# Patient Record
Sex: Female | Born: 1960
Health system: Southern US, Community
[De-identification: ages and names within clinical notes are randomized; demographics above are authoritative.]

## PROBLEM LIST (undated history)

## (undated) DIAGNOSIS — I10 Essential (primary) hypertension: Secondary | ICD-10-CM

## (undated) DIAGNOSIS — I5189 Other ill-defined heart diseases: Secondary | ICD-10-CM

## (undated) DIAGNOSIS — I2510 Atherosclerotic heart disease of native coronary artery without angina pectoris: Secondary | ICD-10-CM

## (undated) DIAGNOSIS — M109 Gout, unspecified: Secondary | ICD-10-CM

## (undated) DIAGNOSIS — M7730 Calcaneal spur, unspecified foot: Secondary | ICD-10-CM

## (undated) DIAGNOSIS — I4891 Unspecified atrial fibrillation: Secondary | ICD-10-CM

## (undated) DIAGNOSIS — E785 Hyperlipidemia, unspecified: Secondary | ICD-10-CM

## (undated) DIAGNOSIS — I5032 Chronic diastolic (congestive) heart failure: Secondary | ICD-10-CM

## (undated) DIAGNOSIS — I340 Nonrheumatic mitral (valve) insufficiency: Secondary | ICD-10-CM

## (undated) HISTORY — DX: Calcaneal spur, unspecified foot: M77.30

## (undated) HISTORY — DX: Hyperlipidemia, unspecified: E78.5

## (undated) HISTORY — DX: Gout, unspecified: M10.9

## (undated) HISTORY — DX: Essential (primary) hypertension: I10

## (undated) HISTORY — PX: ACHILLES TENDON REPAIR: SUR1153

---

## 2000-02-21 ENCOUNTER — Encounter: Payer: Self-pay | Admitting: Family Medicine

## 2000-02-21 ENCOUNTER — Ambulatory Visit (HOSPITAL_COMMUNITY): Admission: RE | Admit: 2000-02-21 | Discharge: 2000-02-21 | Payer: Self-pay | Admitting: Family Medicine

## 2002-09-26 ENCOUNTER — Ambulatory Visit (HOSPITAL_COMMUNITY): Admission: RE | Admit: 2002-09-26 | Discharge: 2002-09-26 | Payer: Self-pay | Admitting: Family Medicine

## 2002-09-26 ENCOUNTER — Encounter: Payer: Self-pay | Admitting: Family Medicine

## 2002-11-09 ENCOUNTER — Ambulatory Visit (HOSPITAL_COMMUNITY): Admission: RE | Admit: 2002-11-09 | Discharge: 2002-11-09 | Payer: Self-pay | Admitting: Family Medicine

## 2002-11-09 ENCOUNTER — Encounter: Payer: Self-pay | Admitting: Family Medicine

## 2002-12-05 ENCOUNTER — Ambulatory Visit (HOSPITAL_COMMUNITY): Admission: RE | Admit: 2002-12-05 | Discharge: 2002-12-05 | Payer: Self-pay | Admitting: Gastroenterology

## 2003-01-03 ENCOUNTER — Other Ambulatory Visit: Admission: RE | Admit: 2003-01-03 | Discharge: 2003-01-03 | Payer: Self-pay | Admitting: *Deleted

## 2004-12-02 ENCOUNTER — Ambulatory Visit: Payer: Self-pay

## 2005-08-12 ENCOUNTER — Ambulatory Visit: Payer: Self-pay | Admitting: Hematology and Oncology

## 2005-10-23 ENCOUNTER — Ambulatory Visit: Payer: Self-pay | Admitting: Hematology and Oncology

## 2005-12-09 ENCOUNTER — Ambulatory Visit: Payer: Self-pay | Admitting: Hematology and Oncology

## 2006-03-22 ENCOUNTER — Ambulatory Visit: Payer: Self-pay | Admitting: Hematology and Oncology

## 2006-06-22 ENCOUNTER — Ambulatory Visit: Payer: Self-pay | Admitting: Hematology and Oncology

## 2006-06-24 ENCOUNTER — Encounter (INDEPENDENT_AMBULATORY_CARE_PROVIDER_SITE_OTHER): Payer: Self-pay | Admitting: Specialist

## 2006-06-24 ENCOUNTER — Other Ambulatory Visit: Admission: RE | Admit: 2006-06-24 | Discharge: 2006-06-24 | Payer: Self-pay | Admitting: Hematology and Oncology

## 2006-06-24 LAB — CBC WITH DIFFERENTIAL/PLATELET
BASO%: 0.2 % (ref 0.0–2.0)
Eosinophils Absolute: 0.2 10*3/uL (ref 0.0–0.5)
HCT: 35.1 % (ref 34.8–46.6)
LYMPH%: 28.3 % (ref 14.0–48.0)
MCHC: 33.6 g/dL (ref 32.0–36.0)
MONO#: 0.2 10*3/uL (ref 0.1–0.9)
NEUT#: 3.9 10*3/uL (ref 1.5–6.5)
NEUT%: 64.6 % (ref 39.6–76.8)
Platelets: 438 10*3/uL — ABNORMAL HIGH (ref 145–400)
RBC: 4.09 10*6/uL (ref 3.70–5.32)
WBC: 6.1 10*3/uL (ref 3.9–10.0)
lymph#: 1.7 10*3/uL (ref 0.9–3.3)

## 2006-07-10 LAB — CBC WITH DIFFERENTIAL/PLATELET
Basophils Absolute: 0 10*3/uL (ref 0.0–0.1)
HCT: 34.4 % — ABNORMAL LOW (ref 34.8–46.6)
HGB: 11.7 g/dL (ref 11.6–15.9)
LYMPH%: 22.9 % (ref 14.0–48.0)
MCHC: 33.9 g/dL (ref 32.0–36.0)
MONO#: 0.5 10*3/uL (ref 0.1–0.9)
NEUT%: 68.8 % (ref 39.6–76.8)
Platelets: 566 10*3/uL — ABNORMAL HIGH (ref 145–400)
WBC: 9 10*3/uL (ref 3.9–10.0)
lymph#: 2.1 10*3/uL (ref 0.9–3.3)

## 2006-09-24 ENCOUNTER — Ambulatory Visit: Payer: Self-pay | Admitting: Hematology and Oncology

## 2006-09-29 LAB — BCR/ABL QUALITATIVE: BCR/ABL t(9,22): NEGATIVE

## 2006-09-29 LAB — JAK2 GENOTYPR

## 2007-04-07 ENCOUNTER — Ambulatory Visit: Payer: Self-pay | Admitting: Hematology and Oncology

## 2010-04-04 DIAGNOSIS — D649 Anemia, unspecified: Secondary | ICD-10-CM | POA: Insufficient documentation

## 2010-04-04 DIAGNOSIS — E785 Hyperlipidemia, unspecified: Secondary | ICD-10-CM | POA: Insufficient documentation

## 2011-03-14 NOTE — Op Note (Signed)
   [name], Tammy Chavez                        ACCOUNT NO.:  [id number]   MEDICAL RECORD NO.:  [id number]                   PATIENT TYPE:  AMB   LOCATION:  ENDO                                 FACILITY:  [facility]   PHYSICIAN:  John C. Madilyn Fireman, M.D.                 DATE OF BIRTH:  May 29, 1961   DATE OF PROCEDURE:  12/05/2002  DATE OF DISCHARGE:                                 OPERATIVE REPORT   PROCEDURE:  Colonoscopy.   INDICATIONS FOR PROCEDURE:  Iron deficiency anemia, the procedure is to rule  out neoplasm of the colon or inflammatory condition of the colon as a source  of iron deficiency anemia for possible GI blood loss.   DESCRIPTION OF PROCEDURE:  The patient was placed in the left lateral  decubitus position and placed on the pulse monitor with continuous low flow  oxygen delivered by nasal cannula. She was sedated with 100 micrograms of IV  Fentanyl and 8 mg of IV Versed.   The Olympus video colonoscope was inserted into the rectum and advanced to  the cecum, confirmed by transillumination of McBurney's point and  visualization of the ileocecal valve and appendiceal orifice.  The prep was  excellent.   The cecum, ascending , transverse, descending and sigmoid colon all appeared  normal with no masses, polyps, diverticula or other mucosal abnormalities.  The rectum likewise appeared normal. Retroflexed view of the anus revealed  no obvious internal hemorrhoids.   The colonoscope was then withdrawn and the patient was returned to the  recovery room in stable condition. She tolerated the procedure well and  there were no immediate complications.   IMPRESSION:  Normal colonoscopy.   PLAN:  Iron supplementation and other workup for her anemia per her primary  care physician.                                                  John C. Madilyn Fireman, M.D.    J[facility]/MEDQ  D:  12/05/2002  T:  12/05/2002  Job:  [id number]   cc:   Pam Drown, M.D.  [address]  [place]  [place] [address]  Fax: [phone]

## 2011-07-25 DIAGNOSIS — I10 Essential (primary) hypertension: Secondary | ICD-10-CM | POA: Insufficient documentation

## 2011-07-25 DIAGNOSIS — M109 Gout, unspecified: Secondary | ICD-10-CM | POA: Insufficient documentation

## 2013-03-01 ENCOUNTER — Other Ambulatory Visit: Payer: Self-pay

## 2013-03-01 MED ORDER — ATORVASTATIN CALCIUM 80 MG PO TABS: 80.0000 mg | ORAL_TABLET | Freq: Every day | ORAL | Status: DC

## 2013-03-01 MED ORDER — POTASSIUM CHLORIDE ER 10 MEQ PO TBCR: 10.0000 meq | EXTENDED_RELEASE_TABLET | Freq: Two times a day (BID) | ORAL | Status: DC

## 2013-03-01 MED ORDER — ALLOPURINOL 300 MG PO TABS: 300.0000 mg | ORAL_TABLET | Freq: Every day | ORAL | Status: DC

## 2013-03-01 MED ORDER — BENAZEPRIL HCL 40 MG PO TABS: 40.0000 mg | ORAL_TABLET | Freq: Every day | ORAL | Status: DC

## 2013-03-01 MED ORDER — FUROSEMIDE 40 MG PO TABS: 40.0000 mg | ORAL_TABLET | Freq: Every day | ORAL | Status: DC

## 2013-03-01 NOTE — Telephone Encounter (Signed)
Last seen 10/01/12   Last lipids 09/22/12   If approved print and have nurse call patient to pick up

## 2013-03-07 ENCOUNTER — Other Ambulatory Visit: Payer: Self-pay | Admitting: Family Medicine

## 2013-03-08 ENCOUNTER — Telehealth: Payer: Self-pay | Admitting: *Deleted

## 2013-03-08 MED ORDER — ATORVASTATIN CALCIUM 80 MG PO TABS: 80.0000 mg | ORAL_TABLET | Freq: Every day | ORAL | Status: DC

## 2013-03-08 MED ORDER — POTASSIUM CHLORIDE ER 10 MEQ PO TBCR: 10.0000 meq | EXTENDED_RELEASE_TABLET | Freq: Two times a day (BID) | ORAL | Status: DC

## 2013-03-08 MED ORDER — FUROSEMIDE 40 MG PO TABS: 40.0000 mg | ORAL_TABLET | Freq: Every day | ORAL | Status: DC

## 2013-03-08 MED ORDER — BENAZEPRIL HCL 40 MG PO TABS: 40.0000 mg | ORAL_TABLET | Freq: Every day | ORAL | Status: DC

## 2013-03-08 MED ORDER — ALLOPURINOL 300 MG PO TABS: 300.0000 mg | ORAL_TABLET | Freq: Every day | ORAL | Status: DC

## 2013-03-08 NOTE — Telephone Encounter (Signed)
Pt lost the ones that were done on 03/01/13. Call pt at [phone] or [phone]

## 2013-03-08 NOTE — Telephone Encounter (Signed)
Left messages for pt , Rx 's  are ready for pick up.

## 2013-03-11 ENCOUNTER — Ambulatory Visit: Payer: Self-pay | Admitting: Nurse Practitioner

## 2013-03-24 ENCOUNTER — Ambulatory Visit (INDEPENDENT_AMBULATORY_CARE_PROVIDER_SITE_OTHER): Payer: BC Managed Care – PPO | Admitting: Physician Assistant

## 2013-03-24 ENCOUNTER — Encounter: Payer: Self-pay | Admitting: Physician Assistant

## 2013-03-24 VITALS — BP 92/57 | HR 72 | Temp 98.3°F | Ht 60.0 in | Wt 262.0 lb

## 2013-03-24 DIAGNOSIS — E785 Hyperlipidemia, unspecified: Secondary | ICD-10-CM

## 2013-03-24 DIAGNOSIS — I10 Essential (primary) hypertension: Secondary | ICD-10-CM

## 2013-03-24 DIAGNOSIS — E876 Hypokalemia: Secondary | ICD-10-CM

## 2013-03-24 DIAGNOSIS — Z23 Encounter for immunization: Secondary | ICD-10-CM

## 2013-03-24 DIAGNOSIS — M109 Gout, unspecified: Secondary | ICD-10-CM

## 2013-03-24 LAB — BASIC METABOLIC PANEL WITH GFR
BUN: 22 mg/dL (ref 6–23)
Calcium: 10.4 mg/dL (ref 8.4–10.5)
Creat: 0.98 mg/dL (ref 0.50–1.10)
GFR, Est African American: 77 mL/min
GFR, Est Non African American: 67 mL/min

## 2013-03-24 LAB — HEPATIC FUNCTION PANEL
ALT: 26 U/L (ref 0–35)
AST: 22 U/L (ref 0–37)
Albumin: 4.4 g/dL (ref 3.5–5.2)
Alkaline Phosphatase: 52 U/L (ref 39–117)
Total Protein: 7.8 g/dL (ref 6.0–8.3)

## 2013-03-24 LAB — LIPID PANEL
Cholesterol: 252 mg/dL — ABNORMAL HIGH (ref 0–200)
HDL: 48 mg/dL (ref >39)
LDL Cholesterol: 156 mg/dL — ABNORMAL HIGH (ref 0–99)
Triglycerides: 240 mg/dL — ABNORMAL HIGH (ref <150)

## 2013-03-24 MED ORDER — ATORVASTATIN CALCIUM 80 MG PO TABS: 80.0000 mg | ORAL_TABLET | Freq: Every day | ORAL | Status: DC

## 2013-03-24 MED ORDER — CHOLINE FENOFIBRATE 135 MG PO CPDR: 135.0000 mg | DELAYED_RELEASE_CAPSULE | Freq: Every day | ORAL | Status: DC

## 2013-03-24 MED ORDER — EZETIMIBE 10 MG PO TABS: 10.0000 mg | ORAL_TABLET | Freq: Every day | ORAL | Status: DC

## 2013-03-24 MED ORDER — FUROSEMIDE 40 MG PO TABS: 40.0000 mg | ORAL_TABLET | Freq: Every day | ORAL | Status: DC

## 2013-03-24 MED ORDER — BENAZEPRIL HCL 40 MG PO TABS: 40.0000 mg | ORAL_TABLET | Freq: Every day | ORAL | Status: DC

## 2013-03-24 MED ORDER — POTASSIUM CHLORIDE ER 10 MEQ PO TBCR: 10.0000 meq | EXTENDED_RELEASE_TABLET | Freq: Two times a day (BID) | ORAL | Status: DC

## 2013-03-24 MED ORDER — ALLOPURINOL 300 MG PO TABS: 300.0000 mg | ORAL_TABLET | Freq: Every day | ORAL | Status: DC

## 2013-03-24 NOTE — Progress Notes (Signed)
Subjective:     Patient ID: Tammy Chavez, female   DOB: 1961-07-12, 52 y.o.   MRN: [id number]  HPI Pt here for review of her gout, HTN, hyperlipid, hypokal She states all in all she has been doing well Pt has been unable to afford Zetia so has not been taking Strong FH of resistant lipid elevation She denies CP, SOB, or change in exercise tolerance Pt with intermit lower ext edema  Review of Systems  All other systems reviewed and are negative.       Objective:   Physical Exam  Nursing note and vitals reviewed. Constitutional: She appears well-developed and well-nourished.  HENT:  Mouth/Throat: Oropharynx is clear and moist.  Neck: Neck supple. No JVD present. No thyromegaly present.  No bruits  Cardiovascular: Normal rate, regular rhythm, normal heart sounds and intact distal pulses.   Trace lower ext edema  Pulmonary/Chest: Effort normal and breath sounds normal.  Lymphadenopathy:    She has no cervical adenopathy.  Skin: Skin is warm and dry.       Assessment:     1. Need for Tdap vaccination   2. Hypertension   3. Hyperlipidemia   4. Hypokalemia   5. Gout   6. HTN (hypertension)   7. Other and unspecified hyperlipidemia        Plan:     Meds rf today She is going to check on Zetia to see if she can afford- gave savings card Stressed diet/exercise as well Cont meds Will inform of lab results Updated Immun- Tdap Rest of health maint UTD F/U in 6 months

## 2013-03-24 NOTE — Patient Instructions (Addendum)
Tetanus, Diphtheria, Pertussis (Tdap) Vaccine What You Need to Know WHY GET VACCINATED? Tetanus, diphtheria and pertussis can be very serious diseases, even for adolescents and adults. Tdap vaccine can protect us from these diseases. TETANUS (Lockjaw) causes painful muscle tightening and stiffness, usually all over the body.  It can lead to tightening of muscles in the head and neck so you can't open your mouth, swallow, or sometimes even breathe. Tetanus kills about 1 out of 5 people who are infected. DIPHTHERIA can cause a thick coating to form in the back of the throat.  It can lead to breathing problems, paralysis, heart failure, and death. PERTUSSIS (Whooping Cough) causes severe coughing spells, which can cause difficulty breathing, vomiting and disturbed sleep.  It can also lead to weight loss, incontinence, and rib fractures. Up to 2 in 100 adolescents and 5 in 100 adults with pertussis are hospitalized or have complications, which could include pneumonia and death. These diseases are caused by bacteria. Diphtheria and pertussis are spread from person to person through coughing or sneezing. Tetanus enters the body through cuts, scratches, or wounds. Before vaccines, the [place] saw as many as 200,000 cases a year of diphtheria and pertussis, and hundreds of cases of tetanus. Since vaccination began, tetanus and diphtheria have dropped by about 99% and pertussis by about 80%. TDAP VACCINE Tdap vaccine can protect adolescents and adults from tetanus, diphtheria, and pertussis. One dose of Tdap is routinely given at age 11 or 12. People who did not get Tdap at that age should get it as soon as possible. Tdap is especially important for health care professionals and anyone having close contact with a baby younger than 12 months. Pregnant women should get a dose of Tdap during every pregnancy, to protect the newborn from pertussis. Infants are most at risk for severe, life-threatening  complications from pertussis. A similar vaccine, called Td, protects from tetanus and diphtheria, but not pertussis. A Td booster should be given every 10 years. Tdap may be given as one of these boosters if you have not already gotten a dose. Tdap may also be given after a severe cut or burn to prevent tetanus infection. Your doctor can give you more information. Tdap may safely be given at the same time as other vaccines. SOME PEOPLE SHOULD NOT GET THIS VACCINE  If you ever had a life-threatening allergic reaction after a dose of any tetanus, diphtheria, or pertussis containing vaccine, OR if you have a severe allergy to any part of this vaccine, you should not get Tdap. Tell your doctor if you have any severe allergies.  If you had a coma, or long or multiple seizures within 7 days after a childhood dose of DTP or DTaP, you should not get Tdap, unless a cause other than the vaccine was found. You can still get Td.  Talk to your doctor if you:  have epilepsy or another nervous system problem,  had severe pain or swelling after any vaccine containing diphtheria, tetanus or pertussis,  ever had Guillain-Barr Syndrome (GBS),  aren't feeling well on the day the shot is scheduled. RISKS OF A VACCINE REACTION With any medicine, including vaccines, there is a chance of side effects. These are usually mild and go away on their own, but serious reactions are also possible. Brief fainting spells can follow a vaccination, leading to injuries from falling. Sitting or lying down for about 15 minutes can help prevent these. Tell your doctor if you feel dizzy or light-headed, or   have vision changes or ringing in the ears. Mild problems following Tdap (Did not interfere with activities)  Pain where the shot was given (about 3 in 4 adolescents or 2 in 3 adults)  Redness or swelling where the shot was given (about 1 person in 5)  Mild fever of at least 100.73F (up to about 1 in 25 adolescents or 1 in  100 adults)  Headache (about 3 or 4 people in 10)  Tiredness (about 1 person in 3 or 4)  Nausea, vomiting, diarrhea, stomach ache (up to 1 in 4 adolescents or 1 in 10 adults)  Chills, body aches, sore joints, rash, swollen glands (uncommon) Moderate problems following Tdap (Interfered with activities, but did not require medical attention)  Pain where the shot was given (about 1 in 5 adolescents or 1 in 100 adults)  Redness or swelling where the shot was given (up to about 1 in 16 adolescents or 1 in 25 adults)  Fever over 102F (about 1 in 100 adolescents or 1 in 250 adults)  Headache (about 3 in 20 adolescents or 1 in 10 adults)  Nausea, vomiting, diarrhea, stomach ache (up to 1 or 3 people in 100)  Swelling of the entire arm where the shot was given (up to about 3 in 100). Severe problems following Tdap (Unable to perform usual activities, required medical attention)  Swelling, severe pain, bleeding and redness in the arm where the shot was given (rare). A severe allergic reaction could occur after any vaccine (estimated less than 1 in a million doses). WHAT IF THERE IS A SERIOUS REACTION? What should I look for?  Look for anything that concerns you, such as signs of a severe allergic reaction, very high fever, or behavior changes. Signs of a severe allergic reaction can include hives, swelling of the face and throat, difficulty breathing, a fast heartbeat, dizziness, and weakness. These would start a few minutes to a few hours after the vaccination. What should I do?  If you think it is a severe allergic reaction or other emergency that can't wait, call 9-1-1 or get the person to the nearest hospital. Otherwise, call your doctor.  Afterward, the reaction should be reported to the "Vaccine Adverse Event Reporting System" (VAERS). Your doctor might file this report, or you can do it yourself through the VAERS web site at www.vaers.LAgents.no, or by calling 1-(250)887-7684. VAERS is  only for reporting reactions. They do not give medical advice.  THE NATIONAL VACCINE INJURY COMPENSATION PROGRAM The National Vaccine Injury Compensation Program (VICP) is a federal program that was created to compensate people who may have been injured by certain vaccines. Persons who believe they may have been injured by a vaccine can learn about the program and about filing a claim by calling 1-(229)037-6800 or visiting the VICP website at SpiritualWord.at. HOW CAN I LEARN MORE?  Ask your doctor.  Call your local or state health department.  Contact the Centers for Disease Control and Prevention (CDC):  Call 670 094 9909 or visit CDC's website at PicCapture.uy. CDC Tdap Vaccine VIS (03/04/12) Document Released: 04/13/2012 Document Revised: 07/07/2012 Document Reviewed: 04/13/2012 [employer] Patient Information 2014 [place], [place]. Hypertriglyceridemia  Diet for High blood levels of Triglycerides Most fats in food are triglycerides. Triglycerides in your blood are stored as fat in your body. High levels of triglycerides in your blood may put you at a greater risk for heart disease and stroke.  Normal triglyceride levels are less than 150 mg/dL. Borderline high levels are 150-199 mg/dl. High  levels are 200 - 499 mg/dL, and very high triglyceride levels are greater than 500 mg/dL. The decision to treat high triglycerides is generally based on the level. For people with borderline or high triglyceride levels, treatment includes weight loss and exercise. Drugs are recommended for people with very high triglyceride levels. Many people who need treatment for high triglyceride levels have metabolic syndrome. This syndrome is a collection of disorders that often include: insulin resistance, high blood pressure, blood clotting problems, high cholesterol and triglycerides. TESTING PROCEDURE FOR TRIGLYCERIDES  You should not eat 4 hours before getting your triglycerides measured.  The normal range of triglycerides is between 10 and 250 milligrams per deciliter (mg/dl). Some people may have extreme levels (1000 or above), but your triglyceride level may be too high if it is above 150 mg/dl, depending on what other risk factors you have for heart disease.  People with high blood triglycerides may also have high blood cholesterol levels. If you have high blood cholesterol as well as high blood triglycerides, your risk for heart disease is probably greater than if you only had high triglycerides. High blood cholesterol is one of the main risk factors for heart disease. CHANGING YOUR DIET  Your weight can affect your blood triglyceride level. If you are more than 20% above your ideal body weight, you may be able to lower your blood triglycerides by losing weight. Eating less and exercising regularly is the best way to combat this. Fat provides more calories than any other food. The best way to lose weight is to eat less fat. Only 30% of your total calories should come from fat. Less than 7% of your diet should come from saturated fat. A diet low in fat and saturated fat is the same as a diet to decrease blood cholesterol. By eating a diet lower in fat, you may lose weight, lower your blood cholesterol, and lower your blood triglyceride level.  Eating a diet low in fat, especially saturated fat, may also help you lower your blood triglyceride level. Ask your dietitian to help you figure how much fat you can eat based on the number of calories your caregiver has prescribed for you.  Exercise, in addition to helping with weight loss may also help lower triglyceride levels.   Alcohol can increase blood triglycerides. You may need to stop drinking alcoholic beverages.  Too much carbohydrate in your diet may also increase your blood triglycerides. Some complex carbohydrates are necessary in your diet. These may include bread, rice, potatoes, other starchy vegetables and cereals.  Reduce  "simple" carbohydrates. These may include pure sugars, candy, honey, and jelly without losing other nutrients. If you have the kind of high blood triglycerides that is affected by the amount of carbohydrates in your diet, you will need to eat less sugar and less high-sugar foods. Your caregiver can help you with this.  Adding 2-4 grams of fish oil (EPA+ DHA) may also help lower triglycerides. Speak with your caregiver before adding any supplements to your regimen. Following the Diet  Maintain your ideal weight. Your caregivers can help you with a diet. Generally, eating less food and getting more exercise will help you lose weight. Joining a weight control group may also help. Ask your caregivers for a good weight control group in your area.  Eat low-fat foods instead of high-fat foods. This can help you lose weight too.  These foods are lower in fat. Eat MORE of these:   Dried beans, peas, and lentils.  Egg whites.  Low-fat cottage cheese.  Fish.  Lean cuts of meat, such as round, sirloin, rump, and flank (cut extra fat off meat you fix).  Whole grain breads, cereals and pasta.  Skim and nonfat dry milk.  Low-fat yogurt.  Poultry without the skin.  Cheese made with skim or part-skim milk, such as mozzarella, parmesan, farmers', ricotta, or pot cheese. These are higher fat foods. Eat LESS of these:   Whole milk and foods made from whole milk, such as American, blue, cheddar, monterey jack, and swiss cheese  High-fat meats, such as luncheon meats, sausages, knockwurst, bratwurst, hot dogs, ribs, corned beef, ground pork, and regular ground beef.  Fried foods. Limit saturated fats in your diet. Substituting unsaturated fat for saturated fat may decrease your blood triglyceride level. You will need to read package labels to know which products contain saturated fats.  These foods are high in saturated fat. Eat LESS of these:   Fried pork skins.  Whole milk.  Skin and fat from  poultry.  Palm oil.  Butter.  Shortening.  Cream cheese.  Tomasa Blase.  Margarines and baked goods made from listed oils.  Vegetable shortenings.  Chitterlings.  Fat from meats.  Coconut oil.  Palm kernel oil.  Lard.  Cream.  Sour cream.  Fatback.  Coffee whiteners and non-dairy creamers made with these oils.  Cheese made from whole milk. Use unsaturated fats (both polyunsaturated and monounsaturated) moderately. Remember, even though unsaturated fats are better than saturated fats; you still want a diet low in total fat.  These foods are high in unsaturated fat:   Canola oil.  Sunflower oil.  Mayonnaise.  Almonds.  Peanuts.  Pine nuts.  Margarines made with these oils.  Safflower oil.  Olive oil.  Avocados.  Cashews.  Peanut butter.  Sunflower seeds.  Soybean oil.  Peanut oil.  Olives.  Pecans.  Walnuts.  Pumpkin seeds. Avoid sugar and other high-sugar foods. This will decrease carbohydrates without decreasing other nutrients. Sugar in your food goes rapidly to your blood. When there is excess sugar in your blood, your liver may use it to make more triglycerides. Sugar also contains calories without other important nutrients.  Eat LESS of these:   Sugar, brown sugar, powdered sugar, jam, jelly, preserves, honey, syrup, molasses, pies, candy, cakes, cookies, frosting, pastries, colas, soft drinks, punches, fruit drinks, and regular gelatin.  Avoid alcohol. Alcohol, even more than sugar, may increase blood triglycerides. In addition, alcohol is high in calories and low in nutrients. Ask for sparkling water, or a diet soft drink instead of an alcoholic beverage. Suggestions for planning and preparing meals   Bake, broil, grill or roast meats instead of frying.  Remove fat from meats and skin from poultry before cooking.  Add spices, herbs, lemon juice or vinegar to vegetables instead of salt, rich sauces or gravies.  Use a non-stick  skillet without fat or use no-stick sprays.  Cool and refrigerate stews and broth. Then remove the hardened fat floating on the surface before serving.  Refrigerate meat drippings and skim off fat to make low-fat gravies.  Serve more fish.  Use less butter, margarine and other high-fat spreads on bread or vegetables.  Use skim or reconstituted non-fat dry milk for cooking.  Cook with low-fat cheeses.  Substitute low-fat yogurt or cottage cheese for all or part of the sour cream in recipes for sauces, dips or congealed salads.  Use half yogurt/half mayonnaise in salad recipes.  Substitute evaporated skim milk  for cream. Evaporated skim milk or reconstituted non-fat dry milk can be whipped and substituted for whipped cream in certain recipes.  Choose fresh fruits for dessert instead of high-fat foods such as pies or cakes. Fruits are naturally low in fat. When Dining Out   Order low-fat appetizers such as fruit or vegetable juice, pasta with vegetables or tomato sauce.  Select clear, rather than cream soups.  Ask that dressings and gravies be served on the side. Then use less of them.  Order foods that are baked, broiled, poached, steamed, stir-fried, or roasted.  Ask for margarine instead of butter, and use only a small amount.  Drink sparkling water, unsweetened tea or coffee, or diet soft drinks instead of alcohol or other sweet beverages. QUESTIONS AND ANSWERS ABOUT OTHER FATS IN THE BLOOD: SATURATED FAT, TRANS FAT, AND CHOLESTEROL What is trans fat? Trans fat is a type of fat that is formed when vegetable oil is hardened through a process called hydrogenation. This process helps makes foods more solid, gives them shape, and prolongs their shelf life. Trans fats are also called hydrogenated or partially hydrogenated oils.  What do saturated fat, trans fat, and cholesterol in foods have to do with heart disease? Saturated fat, trans fat, and cholesterol in the diet all raise  the level of LDL "bad" cholesterol in the blood. The higher the LDL cholesterol, the greater the risk for coronary heart disease (CHD). Saturated fat and trans fat raise LDL similarly.  What foods contain saturated fat, trans fat, and cholesterol? High amounts of saturated fat are found in animal products, such as fatty cuts of meat, chicken skin, and full-fat dairy products like butter, whole milk, cream, and cheese, and in tropical vegetable oils such as palm, palm kernel, and coconut oil. Trans fat is found in some of the same foods as saturated fat, such as vegetable shortening, some margarines (especially hard or stick margarine), crackers, cookies, baked goods, fried foods, salad dressings, and other processed foods made with partially hydrogenated vegetable oils. Small amounts of trans fat also occur naturally in some animal products, such as milk products, beef, and lamb. Foods high in cholesterol include liver, other organ meats, egg yolks, shrimp, and full-fat dairy products. How can I use the new food label to make heart-healthy food choices? Check the Nutrition Facts panel of the food label. Choose foods lower in saturated fat, trans fat, and cholesterol. For saturated fat and cholesterol, you can also use the Percent Daily Value (%DV): 5% DV or less is low, and 20% DV or more is high. (There is no %DV for trans fat.) Use the Nutrition Facts panel to choose foods low in saturated fat and cholesterol, and if the trans fat is not listed, read the ingredients and limit products that list shortening or hydrogenated or partially hydrogenated vegetable oil, which tend to be high in trans fat. POINTS TO REMEMBER:   Discuss your risk for heart disease with your caregivers, and take steps to reduce risk factors.  Change your diet. Choose foods that are low in saturated fat, trans fat, and cholesterol.  Add exercise to your daily routine if it is not already being done. Participate in physical activity  of moderate intensity, like brisk walking, for at least 30 minutes on most, and preferably all days of the week. No time? Break the 30 minutes into three, 10-minute segments during the day.  Stop smoking. If you do smoke, contact your caregiver to discuss ways in which they can  help you quit.  Do not use street drugs.  Maintain a normal weight.  Maintain a healthy blood pressure.  Keep up with your blood work for checking the fats in your blood as directed by your caregiver. Document Released: 07/31/2004 Document Revised: 04/13/2012 Document Reviewed: 02/26/2009 [facility] Patient Information 2014 [place], [place].

## 2013-03-25 ENCOUNTER — Encounter: Payer: Self-pay | Admitting: *Deleted

## 2013-08-24 ENCOUNTER — Ambulatory Visit (INDEPENDENT_AMBULATORY_CARE_PROVIDER_SITE_OTHER): Payer: BC Managed Care – PPO | Admitting: General Practice

## 2013-08-24 ENCOUNTER — Telehealth: Payer: Self-pay | Admitting: Nurse Practitioner

## 2013-08-24 ENCOUNTER — Encounter: Payer: Self-pay | Admitting: General Practice

## 2013-08-24 VITALS — BP 105/64 | HR 85 | Temp 98.2°F | Ht 60.0 in | Wt 270.0 lb

## 2013-08-24 DIAGNOSIS — R05 Cough: Secondary | ICD-10-CM

## 2013-08-24 DIAGNOSIS — J069 Acute upper respiratory infection, unspecified: Secondary | ICD-10-CM

## 2013-08-24 DIAGNOSIS — R059 Cough, unspecified: Secondary | ICD-10-CM

## 2013-08-24 MED ORDER — AZITHROMYCIN 250 MG PO TABS: ORAL_TABLET | ORAL | Status: DC

## 2013-08-24 MED ORDER — BENZONATATE 100 MG PO CAPS: 100.0000 mg | ORAL_CAPSULE | Freq: Three times a day (TID) | ORAL | Status: DC | PRN

## 2013-08-24 NOTE — Progress Notes (Signed)
  Subjective:    Patient ID: Tammy Chavez, female    DOB: 20-Jun-1961, 52 y.o.   MRN: [id number]  Cough This is a new problem. The current episode started in the past 7 days. The problem has been gradually worsening. The problem occurs every few minutes. The cough is productive of purulent sputum. Associated symptoms include chills, ear congestion and nasal congestion. Pertinent negatives include no chest pain, fever, headaches, postnasal drip, sore throat or shortness of breath. The symptoms are aggravated by lying down. She has tried OTC cough suppressant for the symptoms. There is no history of asthma, bronchitis, COPD or pneumonia.      Review of Systems  Constitutional: Positive for chills. Negative for fever.  HENT: Positive for congestion and sneezing. Negative for postnasal drip, sinus pressure and sore throat.   Respiratory: Positive for cough. Negative for shortness of breath.   Cardiovascular: Negative for chest pain.  Neurological: Negative for dizziness, weakness and headaches.       Objective:   Physical Exam  Constitutional: She is oriented to person, place, and time. She appears well-developed and well-nourished.  HENT:  Head: Normocephalic and atraumatic.  Right Ear: External ear normal.  Left Ear: External ear normal.  Mouth/Throat: Posterior oropharyngeal erythema present.  Eyes: Conjunctivae are normal. Pupils are equal, round, and reactive to light.  Neck: Normal range of motion. Neck supple. No thyromegaly present.  Cardiovascular: Normal rate, regular rhythm and normal heart sounds.   Pulmonary/Chest: Effort normal and breath sounds normal. No respiratory distress. She exhibits no tenderness.  Patient coughing during exam  Lymphadenopathy:    She has no cervical adenopathy.  Neurological: She is alert and oriented to person, place, and time.  Skin: Skin is warm and dry.  Psychiatric: She has a normal mood and affect.          Assessment & Plan:  1.  Upper respiratory infection  - azithromycin (ZITHROMAX) 250 MG tablet; Take as directed  Dispense: 6 tablet; Refill: 0   2. Cough  - benzonatate (TESSALON PERLES) 100 MG capsule; Take 1 capsule (100 mg total) by mouth 3 (three) times daily as needed for cough.  Dispense: 30 capsule; Refill: 0 -RTO if symptoms worsen or unresolved within two weeks -Patient verbalized understanding -Coralie Keens, FNP-C

## 2013-08-24 NOTE — Patient Instructions (Signed)

## 2013-08-24 NOTE — Telephone Encounter (Signed)
Appt given for today 

## 2014-02-03 ENCOUNTER — Telehealth: Payer: Self-pay | Admitting: General Practice

## 2014-02-03 NOTE — Telephone Encounter (Signed)
Appt given per patients request 

## 2014-02-08 ENCOUNTER — Encounter: Payer: Self-pay | Admitting: General Practice

## 2014-02-08 ENCOUNTER — Ambulatory Visit (INDEPENDENT_AMBULATORY_CARE_PROVIDER_SITE_OTHER): Payer: BC Managed Care – PPO | Admitting: General Practice

## 2014-02-08 VITALS — BP 138/68 | Temp 97.7°F | Ht 60.0 in | Wt 268.6 lb

## 2014-02-08 DIAGNOSIS — I10 Essential (primary) hypertension: Secondary | ICD-10-CM

## 2014-02-08 DIAGNOSIS — E785 Hyperlipidemia, unspecified: Secondary | ICD-10-CM

## 2014-02-08 DIAGNOSIS — J302 Other seasonal allergic rhinitis: Secondary | ICD-10-CM

## 2014-02-08 DIAGNOSIS — J309 Allergic rhinitis, unspecified: Secondary | ICD-10-CM

## 2014-02-08 DIAGNOSIS — M109 Gout, unspecified: Secondary | ICD-10-CM

## 2014-02-08 DIAGNOSIS — E876 Hypokalemia: Secondary | ICD-10-CM

## 2014-02-08 LAB — POCT CBC
GRANULOCYTE PERCENT: 63.6 % (ref 37–80)
HCT, POC: 41.1 % (ref 37.7–47.9)
Hemoglobin: 13 g/dL (ref 12.2–16.2)
LYMPH, POC: 2.9 (ref 0.6–3.4)
MCH: 27.2 pg (ref 27–31.2)
MCHC: 31.6 g/dL — AB (ref 31.8–35.4)
MCV: 86 fL (ref 80–97)
MPV: 8 fL (ref 0–99.8)
PLATELET COUNT, POC: 474 10*3/uL — AB (ref 142–424)
POC GRANULOCYTE: 5.7 (ref 2–6.9)
POC LYMPH %: 32.5 % (ref 10–50)
RBC: 4.8 M/uL (ref 4.04–5.48)
RDW, POC: 16.1 %
WBC: 8.9 10*3/uL (ref 4.6–10.2)

## 2014-02-08 MED ORDER — FUROSEMIDE 40 MG PO TABS: 40.0000 mg | ORAL_TABLET | Freq: Every day | ORAL | Status: DC

## 2014-02-08 MED ORDER — BENAZEPRIL HCL 40 MG PO TABS: 40.0000 mg | ORAL_TABLET | Freq: Every day | ORAL | Status: DC

## 2014-02-08 MED ORDER — ATORVASTATIN CALCIUM 80 MG PO TABS: 80.0000 mg | ORAL_TABLET | Freq: Every day | ORAL | Status: DC

## 2014-02-08 MED ORDER — ALLOPURINOL 300 MG PO TABS: 300.0000 mg | ORAL_TABLET | Freq: Every day | ORAL | Status: DC

## 2014-02-08 MED ORDER — POTASSIUM CHLORIDE ER 10 MEQ PO TBCR: 10.0000 meq | EXTENDED_RELEASE_TABLET | Freq: Two times a day (BID) | ORAL | Status: DC

## 2014-02-08 NOTE — Patient Instructions (Signed)

## 2014-02-08 NOTE — Progress Notes (Signed)
   Subjective:    Patient ID: Tammy Chavez, female    DOB: Jun 21, 1961, 53 y.o.   MRN: [id number]  HPI Patient presents today for chronic health follow up. History of hyperlipidemia, hypertension, hypokalemia, and gout. Also reports seasonal allergies. Taking medications as prescribed. Reports walking on treadmill daily and eating regular diet.     Review of Systems  Constitutional: Negative for fever and chills.  HENT: Positive for postnasal drip, rhinorrhea and sneezing.   Respiratory: Negative for chest tightness and shortness of breath.   Cardiovascular: Negative for chest pain and palpitations.  Gastrointestinal: Negative for nausea, vomiting, abdominal pain, diarrhea, constipation and blood in stool.  Genitourinary: Negative for difficulty urinating.  Neurological: Negative for dizziness, weakness and headaches.  Psychiatric/Behavioral: Negative for suicidal ideas and self-injury. The patient is not nervous/anxious.        Objective:   Physical Exam  Constitutional: She is oriented to person, place, and time. She appears well-developed and well-nourished.  HENT:  Head: Normocephalic and atraumatic.  Right Ear: External ear normal.  Left Ear: External ear normal.  Mouth/Throat: Oropharynx is clear and moist.  Eyes: EOM are normal. Pupils are equal, round, and reactive to light.  Neck: Normal range of motion. Neck supple. No thyromegaly present.  Cardiovascular: Normal rate, regular rhythm and normal heart sounds.   Pulmonary/Chest: Effort normal and breath sounds normal. No respiratory distress. She exhibits no tenderness.  Abdominal: Soft. Bowel sounds are normal. She exhibits no distension. There is no tenderness.  Lymphadenopathy:    She has no cervical adenopathy.  Neurological: She is alert and oriented to person, place, and time.  Skin: Skin is warm and dry.  Psychiatric: She has a normal mood and affect.          Assessment & Plan:  1. Hypertension  -  POCT CBC - CMP14+EGFR - benazepril (LOTENSIN) 40 MG tablet; Take 1 tablet (40 mg total) by mouth daily.  Dispense: 90 tablet; Refill: 1 - furosemide (LASIX) 40 MG tablet; Take 1 tablet (40 mg total) by mouth daily.  Dispense: 90 tablet; Refill: 1  2. Hyperlipidemia  - Lipid panel - atorvastatin (LIPITOR) 80 MG tablet; Take 1 tablet (80 mg total) by mouth daily.  Dispense: 90 tablet; Refill: 1  3. Gout  - allopurinol (ZYLOPRIM) 300 MG tablet; Take 1 tablet (300 mg total) by mouth daily.  Dispense: 90 tablet; Refill: 1  4. Hypokalemia  - potassium chloride (KLOR-CON 10) 10 MEQ tablet; Take 1 tablet (10 mEq total) by mouth 2 (two) times daily.  Dispense: 90 tablet; Refill: 1  5. Seasonal allergies -OTC zyrtec, allegra, or claritin as directed -avoid allergens -Continue all current medications Labs pending F/u in 3 months Discussed benefits of regular exercise and healthy eating Patient verbalized understanding Erby Pian, FNP-C

## 2014-02-09 LAB — CMP14+EGFR
ALT: 36 IU/L — ABNORMAL HIGH (ref 0–32)
AST: 46 IU/L — AB (ref 0–40)
Albumin/Globulin Ratio: 1.6 (ref 1.1–2.5)
Albumin: 4.8 g/dL (ref 3.5–5.5)
Alkaline Phosphatase: 47 IU/L (ref 39–117)
BUN/Creatinine Ratio: 30 — ABNORMAL HIGH (ref 9–23)
BUN: 28 mg/dL — AB (ref 6–24)
CALCIUM: 10.6 mg/dL — AB (ref 8.7–10.2)
CO2: 26 mmol/L (ref 18–29)
CREATININE: 0.94 mg/dL (ref 0.57–1.00)
Chloride: 102 mmol/L (ref 97–108)
GFR calc Af Amer: 81 mL/min/{1.73_m2} (ref >59)
GFR calc non Af Amer: 70 mL/min/{1.73_m2} (ref >59)
GLOBULIN, TOTAL: 3 g/dL (ref 1.5–4.5)
GLUCOSE: 92 mg/dL (ref 65–99)
Potassium: 4.8 mmol/L (ref 3.5–5.2)
SODIUM: 145 mmol/L — AB (ref 134–144)
Total Bilirubin: 0.4 mg/dL (ref 0.0–1.2)
Total Protein: 7.8 g/dL (ref 6.0–8.5)

## 2014-02-09 LAB — LIPID PANEL
CHOL/HDL RATIO: 4.2 ratio (ref 0.0–4.4)
Cholesterol, Total: 198 mg/dL (ref 100–199)
HDL: 47 mg/dL (ref >39)
LDL Calculated: 113 mg/dL — ABNORMAL HIGH (ref 0–99)
TRIGLYCERIDES: 190 mg/dL — AB (ref 0–149)
VLDL Cholesterol Cal: 38 mg/dL (ref 5–40)

## 2014-07-12 ENCOUNTER — Other Ambulatory Visit: Payer: Self-pay | Admitting: Family Medicine

## 2014-09-29 ENCOUNTER — Telehealth: Payer: Self-pay | Admitting: Family Medicine

## 2014-09-29 NOTE — Telephone Encounter (Signed)
Pt given appt with Ander SladeBill [place] 12/11 @ 12:30 for her knee pain and hip pain, pt is due for her follow up and blood work to get her meds refilled,pt is aware the appt with Annette StableBill is for her knee pain and hip pain only. She has an appointment with [facility] 12/28 @ 8:15 for her Chronic Medical problems.

## 2014-10-06 ENCOUNTER — Ambulatory Visit (INDEPENDENT_AMBULATORY_CARE_PROVIDER_SITE_OTHER): Payer: BC Managed Care – PPO | Admitting: Family Medicine

## 2014-10-06 ENCOUNTER — Encounter: Payer: Self-pay | Admitting: Family Medicine

## 2014-10-06 VITALS — BP 114/63 | HR 60 | Temp 98.5°F | Ht 60.0 in | Wt 270.0 lb

## 2014-10-06 DIAGNOSIS — M25569 Pain in unspecified knee: Secondary | ICD-10-CM

## 2014-10-06 MED ORDER — NAPROXEN 500 MG PO TABS: 500.0000 mg | ORAL_TABLET | Freq: Two times a day (BID) | ORAL | Status: DC

## 2014-10-09 ENCOUNTER — Telehealth: Payer: Self-pay | Admitting: Family Medicine

## 2014-10-09 DIAGNOSIS — M25569 Pain in unspecified knee: Secondary | ICD-10-CM

## 2014-10-09 MED ORDER — NAPROXEN 500 MG PO TABS
500.0000 mg | ORAL_TABLET | Freq: Two times a day (BID) | ORAL | Status: DC
Start: 2014-10-09 — End: 2015-04-18

## 2014-10-09 NOTE — Telephone Encounter (Signed)
done

## 2014-10-09 NOTE — Progress Notes (Signed)
   Subjective:    Patient ID: Tammy Chavez, female    DOB: [birth year], 53 y.o.   MRN: [id number]  HPI C/o left knee pain and discomfort.  Hurts more when walking up and down stairs.  Review of Systems  Constitutional: Negative for fever.  HENT: Negative for ear pain.   Eyes: Negative for discharge.  Respiratory: Negative for cough.   Cardiovascular: Negative for chest pain.  Gastrointestinal: Negative for abdominal distention.  Endocrine: Negative for polyuria.  Genitourinary: Negative for difficulty urinating.  Musculoskeletal: Negative for gait problem and neck pain.  Skin: Negative for color change and rash.  Neurological: Negative for speech difficulty and headaches.  Psychiatric/Behavioral: Negative for agitation.       Objective:    BP 114/63 mmHg  Pulse 60  Temp(Src) 98.5 F (36.9 C) (Oral)  Ht 5' (1.524 m)  Wt 270 lb (122.471 kg)  BMI 52.73 kg/m2  LMP 11/03/2013 Physical Exam  Constitutional: She is oriented to person, place, and time. She appears well-developed and well-nourished.  HENT:  Head: Normocephalic and atraumatic.  Mouth/Throat: Oropharynx is clear and moist.  Eyes: Pupils are equal, round, and reactive to light.  Neck: Normal range of motion. Neck supple.  Cardiovascular: Normal rate and regular rhythm.   No murmur heard. Pulmonary/Chest: Effort normal and breath sounds normal.  Abdominal: Soft. Bowel sounds are normal. There is no tenderness.  Musculoskeletal:  TTP left pre patellar bursae  Neurological: She is alert and oriented to person, place, and time.  Skin: Skin is warm and dry.  Psychiatric: She has a normal mood and affect.          Assessment & Plan:     ICD-9-CM ICD-10-CM   1. Knee pain, acute, unspecified laterality 719.46 M25.569 naproxen (NAPROSYN) 500 MG tablet     Return if symptoms worsen or fail to improve.  Deatra CanterWilliam J Gael Delude FNP

## 2014-10-23 ENCOUNTER — Ambulatory Visit (INDEPENDENT_AMBULATORY_CARE_PROVIDER_SITE_OTHER): Payer: BC Managed Care – PPO | Admitting: Nurse Practitioner

## 2014-10-23 ENCOUNTER — Encounter: Payer: Self-pay | Admitting: Nurse Practitioner

## 2014-10-23 VITALS — BP 134/82 | HR 70 | Temp 97.0°F | Ht 60.0 in | Wt 277.0 lb

## 2014-10-23 DIAGNOSIS — R600 Localized edema: Secondary | ICD-10-CM | POA: Insufficient documentation

## 2014-10-23 DIAGNOSIS — I10 Essential (primary) hypertension: Secondary | ICD-10-CM

## 2014-10-23 DIAGNOSIS — E669 Obesity, unspecified: Secondary | ICD-10-CM

## 2014-10-23 DIAGNOSIS — E876 Hypokalemia: Secondary | ICD-10-CM | POA: Insufficient documentation

## 2014-10-23 DIAGNOSIS — M1A09X0 Idiopathic chronic gout, multiple sites, without tophus (tophi): Secondary | ICD-10-CM

## 2014-10-23 DIAGNOSIS — R609 Edema, unspecified: Secondary | ICD-10-CM

## 2014-10-23 DIAGNOSIS — E785 Hyperlipidemia, unspecified: Secondary | ICD-10-CM

## 2014-10-23 MED ORDER — FENOFIBRIC ACID 135 MG PO CPDR: DELAYED_RELEASE_CAPSULE | ORAL | Status: DC

## 2014-10-23 MED ORDER — BENAZEPRIL HCL 40 MG PO TABS: ORAL_TABLET | ORAL | Status: DC

## 2014-10-23 MED ORDER — ALLOPURINOL 300 MG PO TABS
ORAL_TABLET | ORAL | Status: DC
Start: 2014-10-23 — End: 2015-04-18

## 2014-10-23 MED ORDER — POTASSIUM CHLORIDE ER 10 MEQ PO TBCR
EXTENDED_RELEASE_TABLET | ORAL | Status: DC
Start: 2014-10-23 — End: 2015-04-18

## 2014-10-23 MED ORDER — FUROSEMIDE 40 MG PO TABS: ORAL_TABLET | ORAL | Status: DC

## 2014-10-23 MED ORDER — ATORVASTATIN CALCIUM 80 MG PO TABS: ORAL_TABLET | ORAL | Status: DC

## 2014-10-23 NOTE — Patient Instructions (Signed)

## 2014-10-23 NOTE — Progress Notes (Signed)
Subjective:    Patient ID: Tammy Chavez, female    DOB: [birth year], 53 y.o.   MRN: [id number]   Patient here today for follow up of chronic medical problems.  Hypertension This is a chronic problem. The current episode started more than 1 year ago. The problem is unchanged. The problem is controlled. Associated symptoms include peripheral edema. Pertinent negatives include no chest pain or palpitations. Risk factors for coronary artery disease include diabetes mellitus, dyslipidemia and obesity. Past treatments include ACE inhibitors and diuretics. The current treatment provides moderate improvement. Compliance problems include diet and exercise.   Hyperlipidemia This is a chronic problem. The current episode started more than 1 year ago. The problem is uncontrolled. Recent lipid tests were reviewed and are high. Exacerbating diseases include obesity. She has no history of diabetes or hypothyroidism. Pertinent negatives include no chest pain. Current antihyperlipidemic treatment includes statins and fibric acid derivatives. The current treatment provides moderate improvement of lipids. Compliance problems include adherence to diet and adherence to exercise.  Risk factors for coronary artery disease include dyslipidemia, family history, hypertension and obesity.  Hypokalemia klor-con daily- no c/o cramps Peripheral edema Lasix helps keep swelling down Gout No recent flare-up    Review of Systems  Constitutional: Negative.   HENT: Negative.   Respiratory: Negative.   Cardiovascular: Negative.  Negative for chest pain and palpitations.  Genitourinary: Negative.   Neurological: Negative.   Psychiatric/Behavioral: Negative.   All other systems reviewed and are negative.      Objective:   Physical Exam  Constitutional: She is oriented to person, place, and time. She appears well-developed and well-nourished.  HENT:  Nose: Nose normal.  Mouth/Throat: Oropharynx is clear and moist.    Eyes: EOM are normal.  Neck: Trachea normal, normal range of motion and full passive range of motion without pain. Neck supple. No JVD present. Carotid bruit is not present. No thyromegaly present.  Cardiovascular: Normal rate, regular rhythm, normal heart sounds and intact distal pulses.  Exam reveals no gallop and no friction rub.   No murmur heard. Pulmonary/Chest: Effort normal and breath sounds normal.  Abdominal: Soft. Bowel sounds are normal. She exhibits no distension and no mass. There is no tenderness.  Musculoskeletal: Normal range of motion.  Lymphadenopathy:    She has no cervical adenopathy.  Neurological: She is alert and oriented to person, place, and time. She has normal reflexes.  Skin: Skin is warm and dry.  Psychiatric: She has a normal mood and affect. Her behavior is normal. Judgment and thought content normal.    BP 134/82 mmHg  Pulse 70  Temp(Src) 97 F (36.1 C) (Oral)  Ht 5' (1.524 m)  Wt 277 lb (125.646 kg)  BMI 54.10 kg/m2  LMP 11/03/2013        Assessment & Plan:  1. Essential hypertension Do not add salt to diet - CMP14+EGFR - benazepril (LOTENSIN) 40 MG tablet; TAKE 1 BY MOUTH DAILY  Dispense: 90 tablet; Refill: 1  2. Idiopathic chronic gout of multiple sites without tophus - allopurinol (ZYLOPRIM) 300 MG tablet; TAKE 1 BY MOUTH DAILY  Dispense: 90 tablet; Refill: 1  3. Hyperlipidemia with target LDL less than 100 Low fta diet - NMR, lipoprofile - atorvastatin (LIPITOR) 80 MG tablet; TAKE 1 BY MOUTH DAILY  Dispense: 90 tablet; Refill: 1 - Choline Fenofibrate (FENOFIBRIC ACID) 135 MG CPDR; TAKE 1 BY MOUTH DAILY  Dispense: 90 capsule; Refill: 1  4. Hypokalemia  - potassium chloride (KLOR-CON 10) 10  MEQ tablet; TAKE 1 BY MOUTH TWICE DAILY  Dispense: 90 tablet; Refill: 1  5. Peripheral edema Elevate legs when sitting - furosemide (LASIX) 40 MG tablet; TAKE 1 BY MOUTH DAILY  Dispense: 90 tablet; Refill: 1  6. Obesity - Ambulatory referral  to General Surgery - Thyroid Panel With TSH   hemoccult cards given to patient with directions Labs pending Health maintenance reviewed Diet and exercise encouraged Continue all meds Follow up  In 3 months   [place], FNP

## 2014-10-24 LAB — CMP14+EGFR
A/G RATIO: 1.5 (ref 1.1–2.5)
ALT: 42 IU/L — ABNORMAL HIGH (ref 0–32)
AST: 47 IU/L — AB (ref 0–40)
Albumin: 4.3 g/dL (ref 3.5–5.5)
Alkaline Phosphatase: 52 IU/L (ref 39–117)
BUN/Creatinine Ratio: 23 (ref 9–23)
BUN: 22 mg/dL (ref 6–24)
CO2: 26 mmol/L (ref 18–29)
Calcium: 10.2 mg/dL (ref 8.7–10.2)
Chloride: 100 mmol/L (ref 97–108)
Creatinine, Ser: 0.94 mg/dL (ref 0.57–1.00)
GFR, EST AFRICAN AMERICAN: 80 mL/min/{1.73_m2} (ref >59)
GFR, EST NON AFRICAN AMERICAN: 69 mL/min/{1.73_m2} (ref >59)
GLOBULIN, TOTAL: 2.9 g/dL (ref 1.5–4.5)
Glucose: 94 mg/dL (ref 65–99)
POTASSIUM: 4.7 mmol/L (ref 3.5–5.2)
SODIUM: 144 mmol/L (ref 134–144)
Total Bilirubin: 0.3 mg/dL (ref 0.0–1.2)
Total Protein: 7.2 g/dL (ref 6.0–8.5)

## 2014-10-24 LAB — THYROID PANEL WITH TSH
Free Thyroxine Index: 2.4 (ref 1.2–4.9)
T3 UPTAKE RATIO: 24 % (ref 24–39)
T4, Total: 10.1 ug/dL (ref 4.5–12.0)
TSH: 3.32 u[IU]/mL (ref 0.450–4.500)

## 2014-10-24 LAB — NMR, LIPOPROFILE
Cholesterol: 168 mg/dL (ref 100–199)
HDL Cholesterol by NMR: 48 mg/dL (ref >39)
HDL Particle Number: 40.2 umol/L (ref >=30.5)
LDL Particle Number: 1337 nmol/L — ABNORMAL HIGH (ref <1000)
LDL SIZE: 20.1 nm (ref >20.5)
LDL-C: 85 mg/dL (ref 0–99)
LP-IR SCORE: 87 — AB (ref <=45)
SMALL LDL PARTICLE NUMBER: 761 nmol/L — AB (ref <=527)
Triglycerides by NMR: 174 mg/dL — ABNORMAL HIGH (ref 0–149)

## 2015-04-18 ENCOUNTER — Ambulatory Visit (INDEPENDENT_AMBULATORY_CARE_PROVIDER_SITE_OTHER): Payer: BLUE CROSS/BLUE SHIELD | Admitting: Nurse Practitioner

## 2015-04-18 ENCOUNTER — Encounter: Payer: Self-pay | Admitting: Nurse Practitioner

## 2015-04-18 VITALS — BP 113/78 | HR 76 | Temp 98.7°F | Ht 60.0 in | Wt 253.0 lb

## 2015-04-18 DIAGNOSIS — I10 Essential (primary) hypertension: Secondary | ICD-10-CM | POA: Diagnosis not present

## 2015-04-18 DIAGNOSIS — E876 Hypokalemia: Secondary | ICD-10-CM

## 2015-04-18 DIAGNOSIS — Z0000 Encounter for general adult medical examination without abnormal findings: Secondary | ICD-10-CM

## 2015-04-18 DIAGNOSIS — Z1212 Encounter for screening for malignant neoplasm of rectum: Secondary | ICD-10-CM | POA: Diagnosis not present

## 2015-04-18 DIAGNOSIS — R609 Edema, unspecified: Secondary | ICD-10-CM

## 2015-04-18 DIAGNOSIS — E669 Obesity, unspecified: Secondary | ICD-10-CM

## 2015-04-18 DIAGNOSIS — E785 Hyperlipidemia, unspecified: Secondary | ICD-10-CM

## 2015-04-18 DIAGNOSIS — M1A09X0 Idiopathic chronic gout, multiple sites, without tophus (tophi): Secondary | ICD-10-CM | POA: Diagnosis not present

## 2015-04-18 LAB — POCT CBC
GRANULOCYTE PERCENT: 62 % (ref 37–80)
HCT, POC: 44 % (ref 37.7–47.9)
HEMOGLOBIN: 13.5 g/dL (ref 12.2–16.2)
LYMPH, POC: 3.7 — AB (ref 0.6–3.4)
MCH, POC: 27.7 pg (ref 27–31.2)
MCHC: 30.6 g/dL — AB (ref 31.8–35.4)
MCV: 90.3 fL (ref 80–97)
MPV: 7.6 fL (ref 0–99.8)
PLATELET COUNT, POC: 502 10*3/uL — AB (ref 142–424)
POC GRANULOCYTE: 6.3 (ref 2–6.9)
POC LYMPH %: 36.8 % (ref 10–50)
RBC: 4.88 M/uL (ref 4.04–5.48)
RDW, POC: 14.9 %
WBC: 10.1 10*3/uL (ref 4.6–10.2)

## 2015-04-18 MED ORDER — FENOFIBRIC ACID 135 MG PO CPDR: DELAYED_RELEASE_CAPSULE | ORAL | Status: DC

## 2015-04-18 MED ORDER — BENAZEPRIL HCL 40 MG PO TABS: ORAL_TABLET | ORAL | Status: DC

## 2015-04-18 MED ORDER — ATORVASTATIN CALCIUM 80 MG PO TABS: ORAL_TABLET | ORAL | Status: DC

## 2015-04-18 MED ORDER — FUROSEMIDE 40 MG PO TABS: ORAL_TABLET | ORAL | Status: DC

## 2015-04-18 MED ORDER — POTASSIUM CHLORIDE ER 10 MEQ PO TBCR: EXTENDED_RELEASE_TABLET | ORAL | Status: DC

## 2015-04-18 MED ORDER — ALLOPURINOL 300 MG PO TABS: ORAL_TABLET | ORAL | Status: DC

## 2015-04-18 NOTE — Progress Notes (Signed)
Subjective:    Patient ID: Tammy Chavez, female    DOB: 1961/06/17, 54 y.o.   MRN: [id number]   Patient here today for complete physical exam no PAP- Had pap and breast exam in December 2015. SHe is doing well today without complaints.  Hypertension This is a chronic problem. The current episode started more than 1 year ago. The problem is unchanged. The problem is controlled. Associated symptoms include peripheral edema. Pertinent negatives include no chest pain or palpitations. Risk factors for coronary artery disease include diabetes mellitus, dyslipidemia and obesity. Past treatments include ACE inhibitors and diuretics. The current treatment provides moderate improvement. Compliance problems include diet and exercise.   Hyperlipidemia This is a chronic problem. The current episode started more than 1 year ago. The problem is uncontrolled. Recent lipid tests were reviewed and are high. Exacerbating diseases include obesity. She has no history of diabetes or hypothyroidism. Pertinent negatives include no chest pain. Current antihyperlipidemic treatment includes statins and fibric acid derivatives. The current treatment provides moderate improvement of lipids. Compliance problems include adherence to diet and adherence to exercise.  Risk factors for coronary artery disease include dyslipidemia, family history, hypertension and obesity.  Hypokalemia klor-con daily- no c/o cramps Peripheral edema Lasix helps keep swelling down Gout No recent flare-up    Review of Systems  Constitutional: Negative.   HENT: Negative.   Respiratory: Negative.   Cardiovascular: Negative.  Negative for chest pain and palpitations.  Genitourinary: Negative.   Neurological: Negative.   Psychiatric/Behavioral: Negative.   All other systems reviewed and are negative.      Objective:   Physical Exam  Constitutional: She is oriented to person, place, and time. She appears well-developed and  well-nourished.  HENT:  Nose: Nose normal.  Mouth/Throat: Oropharynx is clear and moist.  Eyes: EOM are normal.  Neck: Trachea normal, normal range of motion and full passive range of motion without pain. Neck supple. No JVD present. Carotid bruit is not present. No thyromegaly present.  Cardiovascular: Normal rate, regular rhythm, normal heart sounds and intact distal pulses.  Exam reveals no gallop and no friction rub.   No murmur heard. Pulmonary/Chest: Effort normal and breath sounds normal.  Abdominal: Soft. Bowel sounds are normal. She exhibits no distension and no mass. There is no tenderness.  Musculoskeletal: Normal range of motion.  Lymphadenopathy:    She has no cervical adenopathy.  Neurological: She is alert and oriented to person, place, and time. She has normal reflexes.  Skin: Skin is warm and dry.  Psychiatric: She has a normal mood and affect. Her behavior is normal. Judgment and thought content normal.    BP 113/78 mmHg  Pulse 76  Temp(Src) 98.7 F (37.1 C) (Oral)  Ht 5' (1.524 m)  Wt 253 lb (114.76 kg)  BMI 49.41 kg/m2  LMP 11/03/2013        Assessment & Plan:  1. Essential hypertension Do not add slat to diet - CMP14+EGFR - benazepril (LOTENSIN) 40 MG tablet; TAKE 1 BY MOUTH DAILY  Dispense: 90 tablet; Refill: 1  2. Peripheral edema Elevate legs when sitting - furosemide (LASIX) 40 MG tablet; TAKE 1 BY MOUTH DAILY  Dispense: 90 tablet; Refill: 1  3. Obesity Discussed diet and exercise for person with BMI >25 Will recheck weight in 3-6 months   4. Hypokalemia - potassium chloride (KLOR-CON 10) 10 MEQ tablet; TAKE 1 BY MOUTH TWICE DAILY  Dispense: 90 tablet; Refill: 1  5. Hyperlipidemia with target LDL less than  100 Low fat diet - Lipid panel - Choline Fenofibrate (FENOFIBRIC ACID) 135 MG CPDR; TAKE 1 BY MOUTH DAILY  Dispense: 90 capsule; Refill: 1 - atorvastatin (LIPITOR) 80 MG tablet; TAKE 1 BY MOUTH DAILY  Dispense: 90 tablet; Refill: 1  6.  Idiopathic chronic gout of multiple sites without tophus - allopurinol (ZYLOPRIM) 300 MG tablet; TAKE 1 BY MOUTH DAILY  Dispense: 90 tablet; Refill: 1  7. Annual physical exam - POCT CBC - Thyroid Panel With TSH  8. Screening for malignant neoplasm of the rectum - Fecal occult blood, imunochemical; Future hemoccult cards given to patient with directions    Labs pending Health maintenance reviewed Diet and exercise encouraged Continue all meds Follow up  In 3 months   [place], FNP

## 2015-04-18 NOTE — Patient Instructions (Signed)
Fat and Cholesterol Control Diet Fat and cholesterol levels in your blood and organs are influenced by your diet. High levels of fat and cholesterol may lead to diseases of the heart, small and large blood vessels, gallbladder, liver, and pancreas. CONTROLLING FAT AND CHOLESTEROL WITH DIET Although exercise and lifestyle factors are important, your diet is key. That is because certain foods are known to raise cholesterol and others to lower it. The goal is to balance foods for their effect on cholesterol and more importantly, to replace saturated and trans fat with other types of fat, such as monounsaturated fat, polyunsaturated fat, and omega-3 fatty acids. On average, a person should consume no more than 15 to 17 g of saturated fat daily. Saturated and trans fats are considered "bad" fats, and they will raise LDL cholesterol. Saturated fats are primarily found in animal products such as meats, butter, and cream. However, that does not mean you need to give up all your favorite foods. Today, there are good tasting, low-fat, low-cholesterol substitutes for most of the things you like to eat. Choose low-fat or nonfat alternatives. Choose round or loin cuts of red meat. These types of cuts are lowest in fat and cholesterol. Chicken (without the skin), fish, veal, and ground turkey breast are great choices. Eliminate fatty meats, such as hot dogs and salami. Even shellfish have little or no saturated fat. Have a 3 oz (85 g) portion when you eat lean meat, poultry, or fish. Trans fats are also called "partially hydrogenated oils." They are oils that have been scientifically manipulated so that they are solid at room temperature resulting in a longer shelf life and improved taste and texture of foods in which they are added. Trans fats are found in stick margarine, some tub margarines, cookies, crackers, and baked goods.  When baking and cooking, oils are a great substitute for butter. The monounsaturated oils are  especially beneficial since it is believed they lower LDL and raise HDL. The oils you should avoid entirely are saturated tropical oils, such as coconut and palm.  Remember to eat a lot from food groups that are naturally free of saturated and trans fat, including fish, fruit, vegetables, beans, grains (barley, rice, couscous, bulgur wheat), and pasta (without cream sauces).  IDENTIFYING FOODS THAT LOWER FAT AND CHOLESTEROL  Soluble fiber may lower your cholesterol. This type of fiber is found in fruits such as apples, vegetables such as broccoli, potatoes, and carrots, legumes such as beans, peas, and lentils, and grains such as barley. Foods fortified with plant sterols (phytosterol) may also lower cholesterol. You should eat at least 2 g per day of these foods for a cholesterol lowering effect.  Read package labels to identify low-saturated fats, trans fat free, and low-fat foods at the supermarket. Select cheeses that have only 2 to 3 g saturated fat per ounce. Use a heart-healthy tub margarine that is free of trans fats or partially hydrogenated oil. When buying baked goods (cookies, crackers), avoid partially hydrogenated oils. Breads and muffins should be made from whole grains (whole-wheat or whole oat flour, instead of "flour" or "enriched flour"). Buy non-creamy canned soups with reduced salt and no added fats.  FOOD PREPARATION TECHNIQUES  Never deep-fry. If you must fry, either stir-fry, which uses very little fat, or use non-stick cooking sprays. When possible, broil, bake, or roast meats, and steam vegetables. Instead of putting butter or margarine on vegetables, use lemon and herbs, applesauce, and cinnamon (for squash and sweet potatoes). Use nonfat   yogurt, salsa, and low-fat dressings for salads.  LOW-SATURATED FAT / LOW-FAT FOOD SUBSTITUTES Meats / Saturated Fat (g)  Avoid: Steak, marbled (3 oz/85 g) / 11 g  Choose: Steak, lean (3 oz/85 g) / 4 g  Avoid: Hamburger (3 oz/85 g) / 7  g  Choose: Hamburger, lean (3 oz/85 g) / 5 g  Avoid: Ham (3 oz/85 g) / 6 g  Choose: Ham, lean cut (3 oz/85 g) / 2.4 g  Avoid: Chicken, with skin, dark meat (3 oz/85 g) / 4 g  Choose: Chicken, skin removed, dark meat (3 oz/85 g) / 2 g  Avoid: Chicken, with skin, light meat (3 oz/85 g) / 2.5 g  Choose: Chicken, skin removed, light meat (3 oz/85 g) / 1 g Dairy / Saturated Fat (g)  Avoid: Whole milk (1 cup) / 5 g  Choose: Low-fat milk, 2% (1 cup) / 3 g  Choose: Low-fat milk, 1% (1 cup) / 1.5 g  Choose: Skim milk (1 cup) / 0.3 g  Avoid: Hard cheese (1 oz/28 g) / 6 g  Choose: Skim milk cheese (1 oz/28 g) / 2 to 3 g  Avoid: Cottage cheese, 4% fat (1 cup) / 6.5 g  Choose: Low-fat cottage cheese, 1% fat (1 cup) / 1.5 g  Avoid: Ice cream (1 cup) / 9 g  Choose: Sherbet (1 cup) / 2.5 g  Choose: Nonfat frozen yogurt (1 cup) / 0.3 g  Choose: Frozen fruit bar / trace  Avoid: Whipped cream (1 tbs) / 3.5 g  Choose: Nondairy whipped topping (1 tbs) / 1 g Condiments / Saturated Fat (g)  Avoid: Mayonnaise (1 tbs) / 2 g  Choose: Low-fat mayonnaise (1 tbs) / 1 g  Avoid: Butter (1 tbs) / 7 g  Choose: Extra light margarine (1 tbs) / 1 g  Avoid: Coconut oil (1 tbs) / 11.8 g  Choose: Olive oil (1 tbs) / 1.8 g  Choose: Corn oil (1 tbs) / 1.7 g  Choose: Safflower oil (1 tbs) / 1.2 g  Choose: Sunflower oil (1 tbs) / 1.4 g  Choose: Soybean oil (1 tbs) / 2.4 g  Choose: Canola oil (1 tbs) / 1 g Document Released: 10/13/2005 Document Revised: 02/07/2013 Document Reviewed: 01/11/2014 [employer] Patient Information 2015 [employer]. This information is not intended to replace advice given to you by your health care provider. Make sure you discuss any questions you have with your health care provider.  

## 2015-04-19 LAB — CMP14+EGFR
A/G RATIO: 1.5 (ref 1.1–2.5)
ALT: 34 IU/L — ABNORMAL HIGH (ref 0–32)
AST: 30 IU/L (ref 0–40)
Albumin: 5 g/dL (ref 3.5–5.5)
Alkaline Phosphatase: 49 IU/L (ref 39–117)
BILIRUBIN TOTAL: 0.3 mg/dL (ref 0.0–1.2)
BUN / CREAT RATIO: 32 — AB (ref 9–23)
BUN: 35 mg/dL — ABNORMAL HIGH (ref 6–24)
CO2: 21 mmol/L (ref 18–29)
Calcium: 11 mg/dL — ABNORMAL HIGH (ref 8.7–10.2)
Chloride: 99 mmol/L (ref 97–108)
Creatinine, Ser: 1.08 mg/dL — ABNORMAL HIGH (ref 0.57–1.00)
GFR calc non Af Amer: 59 mL/min/{1.73_m2} — ABNORMAL LOW (ref >59)
GFR, EST AFRICAN AMERICAN: 68 mL/min/{1.73_m2} (ref >59)
Globulin, Total: 3.3 g/dL (ref 1.5–4.5)
Glucose: 97 mg/dL (ref 65–99)
Potassium: 4.5 mmol/L (ref 3.5–5.2)
Sodium: 145 mmol/L — ABNORMAL HIGH (ref 134–144)
Total Protein: 8.3 g/dL (ref 6.0–8.5)

## 2015-04-19 LAB — LIPID PANEL
Chol/HDL Ratio: 3.4 ratio units (ref 0.0–4.4)
Cholesterol, Total: 189 mg/dL (ref 100–199)
HDL: 55 mg/dL (ref >39)
LDL Calculated: 104 mg/dL — ABNORMAL HIGH (ref 0–99)
Triglycerides: 151 mg/dL — ABNORMAL HIGH (ref 0–149)
VLDL Cholesterol Cal: 30 mg/dL (ref 5–40)

## 2015-04-19 LAB — THYROID PANEL WITH TSH
Free Thyroxine Index: 1.9 (ref 1.2–4.9)
T3 UPTAKE RATIO: 19 % — AB (ref 24–39)
T4, Total: 10 ug/dL (ref 4.5–12.0)
TSH: 2.54 u[IU]/mL (ref 0.450–4.500)

## 2015-08-03 ENCOUNTER — Ambulatory Visit (INDEPENDENT_AMBULATORY_CARE_PROVIDER_SITE_OTHER): Payer: BLUE CROSS/BLUE SHIELD | Admitting: Nurse Practitioner

## 2015-08-03 ENCOUNTER — Encounter: Payer: Self-pay | Admitting: Nurse Practitioner

## 2015-08-03 VITALS — BP 141/95 | HR 68 | Temp 96.7°F | Ht 60.0 in | Wt 252.0 lb

## 2015-08-03 DIAGNOSIS — E876 Hypokalemia: Secondary | ICD-10-CM

## 2015-08-03 DIAGNOSIS — M1A09X0 Idiopathic chronic gout, multiple sites, without tophus (tophi): Secondary | ICD-10-CM

## 2015-08-03 DIAGNOSIS — R609 Edema, unspecified: Secondary | ICD-10-CM | POA: Diagnosis not present

## 2015-08-03 DIAGNOSIS — I10 Essential (primary) hypertension: Secondary | ICD-10-CM | POA: Diagnosis not present

## 2015-08-03 DIAGNOSIS — E669 Obesity, unspecified: Secondary | ICD-10-CM

## 2015-08-03 DIAGNOSIS — E785 Hyperlipidemia, unspecified: Secondary | ICD-10-CM | POA: Diagnosis not present

## 2015-08-03 DIAGNOSIS — Z1212 Encounter for screening for malignant neoplasm of rectum: Secondary | ICD-10-CM

## 2015-08-03 NOTE — Progress Notes (Signed)
   Subjective:    Patient ID: Tammy Chavez, female    DOB: June 11, 1961, 54 y.o.   MRN: [id number]   Patient here today for follow up of chronic medical problems.   Hypertension This is a chronic problem. The current episode started more than 1 year ago. The problem is unchanged. The problem is controlled. Associated symptoms include peripheral edema. Pertinent negatives include no chest pain or palpitations. Risk factors for coronary artery disease include diabetes mellitus, dyslipidemia and obesity. Past treatments include ACE inhibitors and diuretics. The current treatment provides moderate improvement. Compliance problems include diet and exercise.   Hyperlipidemia This is a chronic problem. The current episode started more than 1 year ago. The problem is uncontrolled. Recent lipid tests were reviewed and are high. Exacerbating diseases include obesity. She has no history of diabetes or hypothyroidism. Pertinent negatives include no chest pain. Current antihyperlipidemic treatment includes statins and fibric acid derivatives. The current treatment provides moderate improvement of lipids. Compliance problems include adherence to diet and adherence to exercise.  Risk factors for coronary artery disease include dyslipidemia, family history, hypertension and obesity.  Hypokalemia klor-con daily- no c/o cramps Peripheral edema Lasix helps keep swelling down Gout No recent flare-up    Review of Systems  Constitutional: Negative.   HENT: Negative.   Respiratory: Negative.   Cardiovascular: Negative.  Negative for chest pain and palpitations.  Genitourinary: Negative.   Neurological: Negative.   Psychiatric/Behavioral: Negative.   All other systems reviewed and are negative.      Objective:   Physical Exam  Constitutional: She is oriented to person, place, and time. She appears well-developed and well-nourished.  HENT:  Nose: Nose normal.  Mouth/Throat: Oropharynx is clear and  moist.  Eyes: EOM are normal.  Neck: Trachea normal, normal range of motion and full passive range of motion without pain. Neck supple. No JVD present. Carotid bruit is not present. No thyromegaly present.  Cardiovascular: Normal rate, regular rhythm, normal heart sounds and intact distal pulses.  Exam reveals no gallop and no friction rub.   No murmur heard. Pulmonary/Chest: Effort normal and breath sounds normal.  Abdominal: Soft. Bowel sounds are normal. She exhibits no distension and no mass. There is no tenderness.  Musculoskeletal: Normal range of motion.  Lymphadenopathy:    She has no cervical adenopathy.  Neurological: She is alert and oriented to person, place, and time. She has normal reflexes.  Skin: Skin is warm and dry.  Psychiatric: She has a normal mood and affect. Her behavior is normal. Judgment and thought content normal.    BP 141/95 mmHg  Pulse 68  Temp(Src) 96.7 F (35.9 C) (Oral)  Ht 5' (1.524 m)  Wt 252 lb (114.306 kg)  BMI 49.22 kg/m2  LMP 11/03/2013         Assessment & Plan:  1. Essential hypertension Do not add slat o diet - CMP14+EGFR  2. Hyperlipidemia with target LDL less than 100 Low fat diet - Lipid panel  3. Peripheral edema Elevate legs when swelling  4. Idiopathic chronic gout of multiple sites without tophus   5. Hypokalemia   6. Obesity Discussed diet and exercise for person with BMI >25 Will recheck weight in 3-6 months   7. Screening for malignant neoplasm of the rectum encouraged to do hemoccult cards    Labs pending Health maintenance reviewed Diet and exercise encouraged Continue all meds Follow up  In 3 month   [place], FNP

## 2015-08-03 NOTE — Patient Instructions (Signed)
Exercising to Stay Healthy Exercising regularly is important. It has many health benefits, such as:  Improving your overall fitness, flexibility, and endurance.  Increasing your bone density.  Helping with weight control.  Decreasing your body fat.  Increasing your muscle strength.  Reducing stress and tension.  Improving your overall health. In order to become healthy and stay healthy, it is recommended that you do moderate-intensity and vigorous-intensity exercise. You can tell that you are exercising at a moderate intensity if you have a higher heart rate and faster breathing, but you are still able to hold a conversation. You can tell that you are exercising at a vigorous intensity if you are breathing much harder and faster and cannot hold a conversation while exercising. HOW OFTEN SHOULD I EXERCISE? Choose an activity that you enjoy and set realistic goals. Your health care provider can help you to make an activity plan that works for you. Exercise regularly as directed by your health care provider. This may include:   Doing resistance training twice each week, such as:  Push-ups.  Sit-ups.  Lifting weights.  Using resistance bands.  Doing a given intensity of exercise for a given amount of time. Choose from these options:  150 minutes of moderate-intensity exercise every week.  75 minutes of vigorous-intensity exercise every week.  A mix of moderate-intensity and vigorous-intensity exercise every week. Children, pregnant women, people who are out of shape, people who are overweight, and older adults may need to consult a health care provider for individual recommendations. If you have any sort of medical condition, be sure to consult your health care provider before starting a new exercise program.  WHAT ARE SOME EXERCISE IDEAS? Some moderate-intensity exercise ideas include:   Walking at a rate of 1 mile in 15  minutes.  Biking.  Hiking.  Golfing.  Dancing. Some vigorous-intensity exercise ideas include:   Walking at a rate of at least 4.5 miles per hour.  Jogging or running at a rate of 5 miles per hour.  Biking at a rate of at least 10 miles per hour.  Lap swimming.  Roller-skating or in-line skating.  Cross-country skiing.  Vigorous competitive sports, such as football, basketball, and soccer.  Jumping rope.  Aerobic dancing. WHAT ARE SOME EVERYDAY ACTIVITIES THAT CAN HELP ME TO GET EXERCISE?  Yard work, such as:  Pushing a lawn mower.  Raking and bagging leaves.  Washing and waxing your car.  Pushing a stroller.  Shoveling snow.  Gardening.  Washing windows or floors. HOW CAN I BE MORE ACTIVE IN MY DAY-TO-DAY ACTIVITIES?  Use the stairs instead of the elevator.  Take a walk during your lunch break.  If you drive, park your car farther away from work or school.  If you take public transportation, get off one stop early and walk the rest of the way.  Make all of your phone calls while standing up and walking around.  Get up, stretch, and walk around every 30 minutes throughout the day. WHAT GUIDELINES SHOULD I FOLLOW WHILE EXERCISING?  Do not exercise so much that you hurt yourself, feel dizzy, or get very short of breath.  Consult your health care provider before starting a new exercise program.  Wear comfortable clothes and shoes with good support.  Drink plenty of water while you exercise to prevent dehydration or heat stroke. Body water is lost during exercise and must be replaced.  Work out until you breathe faster and your heart beats faster.   This information   is not intended to replace advice given to you by your health care provider. Make sure you discuss any questions you have with your health care provider.   Document Released: 11/15/2010 Document Revised: 11/03/2014 Document Reviewed: 03/16/2014 Elsevier Interactive Patient Education  2016 [employer]  

## 2015-08-04 LAB — CMP14+EGFR
ALT: 27 IU/L (ref 0–32)
AST: 33 IU/L (ref 0–40)
Albumin/Globulin Ratio: 1.5 (ref 1.1–2.5)
Albumin: 4.6 g/dL (ref 3.5–5.5)
Alkaline Phosphatase: 53 IU/L (ref 39–117)
BUN/Creatinine Ratio: 30 — ABNORMAL HIGH (ref 9–23)
BUN: 31 mg/dL — ABNORMAL HIGH (ref 6–24)
Bilirubin Total: 0.4 mg/dL (ref 0.0–1.2)
CO2: 26 mmol/L (ref 18–29)
Calcium: 10.2 mg/dL (ref 8.7–10.2)
Chloride: 100 mmol/L (ref 97–108)
Creatinine, Ser: 1.04 mg/dL — ABNORMAL HIGH (ref 0.57–1.00)
GFR calc Af Amer: 71 mL/min/1.73 (ref >59)
GFR calc non Af Amer: 61 mL/min/1.73 (ref >59)
Globulin, Total: 3 g/dL (ref 1.5–4.5)
Glucose: 98 mg/dL (ref 65–99)
Potassium: 4.7 mmol/L (ref 3.5–5.2)
Sodium: 145 mmol/L — ABNORMAL HIGH (ref 134–144)
Total Protein: 7.6 g/dL (ref 6.0–8.5)

## 2015-08-04 LAB — LIPID PANEL
Chol/HDL Ratio: 4 ratio (ref 0.0–4.4)
Cholesterol, Total: 175 mg/dL (ref 100–199)
HDL: 44 mg/dL (ref >39)
LDL Calculated: 100 mg/dL — ABNORMAL HIGH (ref 0–99)
Triglycerides: 153 mg/dL — ABNORMAL HIGH (ref 0–149)
VLDL Cholesterol Cal: 31 mg/dL (ref 5–40)

## 2015-09-05 ENCOUNTER — Ambulatory Visit (INDEPENDENT_AMBULATORY_CARE_PROVIDER_SITE_OTHER): Payer: BLUE CROSS/BLUE SHIELD | Admitting: Family Medicine

## 2015-09-05 VITALS — BP 125/67 | HR 74 | Temp 96.9°F | Ht 61.52 in | Wt 253.2 lb

## 2015-09-05 DIAGNOSIS — J208 Acute bronchitis due to other specified organisms: Secondary | ICD-10-CM | POA: Diagnosis not present

## 2015-09-05 MED ORDER — GUAIFENESIN-CODEINE 100-10 MG/5ML PO SOLN: 5.0000 mL | Freq: Three times a day (TID) | ORAL | Status: DC | PRN

## 2015-09-05 MED ORDER — AZITHROMYCIN 250 MG PO TABS: ORAL_TABLET | ORAL | Status: DC

## 2015-09-05 NOTE — Patient Instructions (Signed)
Great to meet you!  Try coricidin HBP for your cough  The cough syrup I have given you has a mild narcotic, do not drive after you take it.   Take the azithromycin if you are not getting better in 3 days.

## 2015-09-05 NOTE — Progress Notes (Signed)
   HPI  Patient presents today here for evaluation of cough.  She explains that she's had cough and nasal congestion for about 10 days. She had subjective fever and malaise at its onset which has improved that she's had continued cough. She started developing throat rawness from her cough. She denies fever, malaise, shortness of breath, chest pain He does have your itching.  She has normal oral intake She states that the cough is really bad night and early morning is permitting sleeping.  PMH: Smoking status noted ROS: Per HPI  Objective: BP 125/67 mmHg  Pulse 74  Temp(Src) 96.9 F (36.1 C) (Oral)  Ht 5' 1.52" (1.563 m)  Wt 253 lb 3.2 oz (114.851 kg)  BMI 47.01 kg/m2  LMP 11/03/2013 Gen: NAD, alert, cooperative with exam HEENT: NCAT, TMs normal bilaterally, oropharynx clear CV: RRR, good S1/S2, no murmur Resp: CTABL, no wheezes, non-labored Ext: No edema, warm Neuro: Alert and oriented, No gross deficits  Assessment and plan:  # Acute bronchitis Considering duration of illness will go ahead and treat with azithromycin, guaifenesin with codeine for nighttime cough Coricidin for daytime cough Return to clinic if symptoms worsen or fail to improve.   Meds ordered this encounter  Medications  . guaiFENesin-codeine 100-10 MG/5ML syrup    Sig: Take 5 mLs by mouth 3 (three) times daily as needed for cough.    Dispense:  120 mL    Refill:  0  . azithromycin (ZITHROMAX) 250 MG tablet    Sig: Take 2 tablets on day 1 and 1 tablet daily after that    Dispense:  6 tablet    Refill:  0    Murtis SinkSam Shanine Kreiger, MD [facility] 09/05/2015, 5:20 PM

## 2015-11-16 ENCOUNTER — Encounter: Payer: Self-pay | Admitting: Nurse Practitioner

## 2015-11-16 ENCOUNTER — Ambulatory Visit (INDEPENDENT_AMBULATORY_CARE_PROVIDER_SITE_OTHER): Payer: BLUE CROSS/BLUE SHIELD | Admitting: Nurse Practitioner

## 2015-11-16 VITALS — BP 132/84 | HR 66 | Temp 96.6°F | Ht 61.0 in | Wt 253.0 lb

## 2015-11-16 DIAGNOSIS — Z1212 Encounter for screening for malignant neoplasm of rectum: Secondary | ICD-10-CM | POA: Diagnosis not present

## 2015-11-16 DIAGNOSIS — E669 Obesity, unspecified: Secondary | ICD-10-CM | POA: Diagnosis not present

## 2015-11-16 DIAGNOSIS — Z1159 Encounter for screening for other viral diseases: Secondary | ICD-10-CM | POA: Diagnosis not present

## 2015-11-16 DIAGNOSIS — E785 Hyperlipidemia, unspecified: Secondary | ICD-10-CM

## 2015-11-16 DIAGNOSIS — E876 Hypokalemia: Secondary | ICD-10-CM | POA: Diagnosis not present

## 2015-11-16 DIAGNOSIS — R609 Edema, unspecified: Secondary | ICD-10-CM

## 2015-11-16 DIAGNOSIS — I10 Essential (primary) hypertension: Secondary | ICD-10-CM | POA: Diagnosis not present

## 2015-11-16 DIAGNOSIS — M1A09X0 Idiopathic chronic gout, multiple sites, without tophus (tophi): Secondary | ICD-10-CM

## 2015-11-16 MED ORDER — BENAZEPRIL HCL 40 MG PO TABS: ORAL_TABLET | ORAL | Status: DC

## 2015-11-16 MED ORDER — ATORVASTATIN CALCIUM 80 MG PO TABS: ORAL_TABLET | ORAL | Status: DC

## 2015-11-16 MED ORDER — FUROSEMIDE 40 MG PO TABS: ORAL_TABLET | ORAL | Status: DC

## 2015-11-16 MED ORDER — POTASSIUM CHLORIDE ER 10 MEQ PO TBCR: EXTENDED_RELEASE_TABLET | ORAL | Status: DC

## 2015-11-16 MED ORDER — FENOFIBRATE 160 MG PO TABS: 160.0000 mg | ORAL_TABLET | Freq: Every day | ORAL | Status: DC

## 2015-11-16 MED ORDER — ALLOPURINOL 300 MG PO TABS: ORAL_TABLET | ORAL | Status: DC

## 2015-11-16 NOTE — Progress Notes (Signed)
Subjective:    Patient ID: Tammy Chavez, female    DOB: [birth year], 55 y.o.   MRN: [id number]   Patient here today for follow up of chronic medical problems.  Outpatient Encounter Prescriptions as of 11/16/2015  Medication Sig  . allopurinol (ZYLOPRIM) 300 MG tablet TAKE 1 BY MOUTH DAILY  . atorvastatin (LIPITOR) 80 MG tablet TAKE 1 BY MOUTH DAILY  . benazepril (LOTENSIN) 40 MG tablet TAKE 1 BY MOUTH DAILY  . Choline Fenofibrate (FENOFIBRIC ACID) 135 MG CPDR TAKE 1 BY MOUTH DAILY  . furosemide (LASIX) 40 MG tablet TAKE 1 BY MOUTH DAILY  . potassium chloride (KLOR-CON 10) 10 MEQ tablet TAKE 1 BY MOUTH TWICE DAILY  . [DISCONTINUED] azithromycin (ZITHROMAX) 250 MG tablet Take 2 tablets on day 1 and 1 tablet daily after that  . [DISCONTINUED] guaiFENesin-codeine 100-10 MG/5ML syrup Take 5 mLs by mouth 3 (three) times daily as needed for cough.   No facility-administered encounter medications on file as of 11/16/2015.    Hypertension This is a chronic problem. The current episode started more than 1 year ago. The problem is unchanged. The problem is controlled. Associated symptoms include peripheral edema. Pertinent negatives include no chest pain or palpitations. Risk factors for coronary artery disease include diabetes mellitus, dyslipidemia and obesity. Past treatments include ACE inhibitors and diuretics. The current treatment provides moderate improvement. Compliance problems include diet and exercise.   Hyperlipidemia This is a chronic problem. The current episode started more than 1 year ago. The problem is uncontrolled. Recent lipid tests were reviewed and are high. Exacerbating diseases include obesity. She has no history of diabetes or hypothyroidism. Pertinent negatives include no chest pain. Current antihyperlipidemic treatment includes statins and fibric acid derivatives. The current treatment provides moderate improvement of lipids. Compliance problems include adherence to diet  and adherence to exercise.  Risk factors for coronary artery disease include dyslipidemia, family history, hypertension and obesity.  Hypokalemia klor-con daily- no c/o cramps Peripheral edema Lasix helps keep swelling down Gout No recent flare-up    Review of Systems  Constitutional: Negative.   HENT: Negative.   Respiratory: Negative.   Cardiovascular: Negative.  Negative for chest pain and palpitations.  Genitourinary: Negative.   Neurological: Negative.   Psychiatric/Behavioral: Negative.   All other systems reviewed and are negative.      Objective:   Physical Exam  Constitutional: She is oriented to person, place, and time. She appears well-developed and well-nourished.  HENT:  Nose: Nose normal.  Mouth/Throat: Oropharynx is clear and moist.  Eyes: EOM are normal.  Neck: Trachea normal, normal range of motion and full passive range of motion without pain. Neck supple. No JVD present. Carotid bruit is not present. No thyromegaly present.  Cardiovascular: Normal rate, regular rhythm, normal heart sounds and intact distal pulses.  Exam reveals no gallop and no friction rub.   No murmur heard. Pulmonary/Chest: Effort normal and breath sounds normal.  Abdominal: Soft. Bowel sounds are normal. She exhibits no distension and no mass. There is no tenderness.  Musculoskeletal: Normal range of motion.  Lymphadenopathy:    She has no cervical adenopathy.  Neurological: She is alert and oriented to person, place, and time. She has normal reflexes.  Skin: Skin is warm and dry.  Psychiatric: She has a normal mood and affect. Her behavior is normal. Judgment and thought content normal.     BP 132/84 mmHg  Pulse 66  Temp(Src) 96.6 F (35.9 C) (Oral)  Ht _0  (1.549  m)  Wt 253 lb (114.76 kg)  BMI 47.83 kg/m2  LMP 11/03/2013     Assessment & Plan:  1. Hyperlipidemia with target LDL less than 100 Low fat diet Changed fenofibrate to 160 mg for insurance reasons - Lipid  panel - fenofibrate 160 MG tablet; Take 1 tablet (160 mg total) by mouth daily.  Dispense: 30 tablet; Refill: 5 - atorvastatin (LIPITOR) 80 MG tablet; TAKE 1 BY MOUTH DAILY  Dispense: 90 tablet; Refill: 1  2. Idiopathic chronic gout of multiple sites without tophus - allopurinol (ZYLOPRIM) 300 MG tablet; TAKE 1 BY MOUTH DAILY  Dispense: 90 tablet; Refill: 1  3. Hypokalemia - potassium chloride (KLOR-CON 10) 10 MEQ tablet; TAKE 1 BY MOUTH TWICE DAILY  Dispense: 90 tablet; Refill: 1  4. Peripheral edema Elevate legs when have swelling - furosemide (LASIX) 40 MG tablet; TAKE 1 BY MOUTH DAILY  Dispense: 90 tablet; Refill: 1  5. Obesity Discussed diet and exercise for person with BMI >25 Will recheck weight in 3-6 months   6. Essential hypertension Do not add salt to diet - CMP14+EGFR - benazepril (LOTENSIN) 40 MG tablet; TAKE 1 BY MOUTH DAILY  Dispense: 90 tablet; Refill: 1  7. Screening for malignant neoplasm of the rectum Encouraged  To do hemoccult cards given at last appointment - Fecal occult blood, imunochemical; Future  8. Need for hepatitis C screening test - Hepatitis C antibody    Labs pending Health maintenance reviewed Diet and exercise encouraged Continue all meds Follow up  In 6 months   [place], FNP

## 2015-11-16 NOTE — Patient Instructions (Signed)

## 2015-11-17 LAB — CMP14+EGFR
A/G RATIO: 1.3 (ref 1.1–2.5)
ALT: 33 IU/L — AB (ref 0–32)
AST: 35 IU/L (ref 0–40)
Albumin: 4.3 g/dL (ref 3.5–5.5)
Alkaline Phosphatase: 100 IU/L (ref 39–117)
BUN/Creatinine Ratio: 22 (ref 9–23)
BUN: 17 mg/dL (ref 6–24)
Bilirubin Total: 0.4 mg/dL (ref 0.0–1.2)
CALCIUM: 10.1 mg/dL (ref 8.7–10.2)
CO2: 24 mmol/L (ref 18–29)
Chloride: 102 mmol/L (ref 96–106)
Creatinine, Ser: 0.78 mg/dL (ref 0.57–1.00)
GFR calc Af Amer: 100 mL/min/{1.73_m2} (ref >59)
GFR, EST NON AFRICAN AMERICAN: 86 mL/min/{1.73_m2} (ref >59)
GLOBULIN, TOTAL: 3.3 g/dL (ref 1.5–4.5)
Glucose: 98 mg/dL (ref 65–99)
POTASSIUM: 4.2 mmol/L (ref 3.5–5.2)
Sodium: 143 mmol/L (ref 134–144)
Total Protein: 7.6 g/dL (ref 6.0–8.5)

## 2015-11-17 LAB — LIPID PANEL
CHOLESTEROL TOTAL: 325 mg/dL — AB (ref 100–199)
Chol/HDL Ratio: 8.3 ratio units — ABNORMAL HIGH (ref 0.0–4.4)
HDL: 39 mg/dL — ABNORMAL LOW (ref >39)
LDL Calculated: 217 mg/dL — ABNORMAL HIGH (ref 0–99)
Triglycerides: 345 mg/dL — ABNORMAL HIGH (ref 0–149)
VLDL Cholesterol Cal: 69 mg/dL — ABNORMAL HIGH (ref 5–40)

## 2015-11-17 LAB — HEPATITIS C ANTIBODY: Hep C Virus Ab: <0.1 s/co ratio (ref 0.0–0.9)

## 2016-03-21 ENCOUNTER — Ambulatory Visit (INDEPENDENT_AMBULATORY_CARE_PROVIDER_SITE_OTHER): Payer: BLUE CROSS/BLUE SHIELD | Admitting: Nurse Practitioner

## 2016-03-21 ENCOUNTER — Encounter: Payer: Self-pay | Admitting: Nurse Practitioner

## 2016-03-21 VITALS — BP 122/80 | HR 65 | Temp 97.1°F | Ht 61.0 in | Wt 245.0 lb

## 2016-03-21 DIAGNOSIS — E876 Hypokalemia: Secondary | ICD-10-CM | POA: Diagnosis not present

## 2016-03-21 DIAGNOSIS — M1A09X0 Idiopathic chronic gout, multiple sites, without tophus (tophi): Secondary | ICD-10-CM

## 2016-03-21 DIAGNOSIS — I10 Essential (primary) hypertension: Secondary | ICD-10-CM | POA: Diagnosis not present

## 2016-03-21 DIAGNOSIS — R609 Edema, unspecified: Secondary | ICD-10-CM | POA: Diagnosis not present

## 2016-03-21 DIAGNOSIS — E669 Obesity, unspecified: Secondary | ICD-10-CM | POA: Diagnosis not present

## 2016-03-21 DIAGNOSIS — R600 Localized edema: Secondary | ICD-10-CM

## 2016-03-21 DIAGNOSIS — E785 Hyperlipidemia, unspecified: Secondary | ICD-10-CM

## 2016-03-21 MED ORDER — ATORVASTATIN CALCIUM 80 MG PO TABS: ORAL_TABLET | ORAL | Status: DC

## 2016-03-21 MED ORDER — FENOFIBRATE 160 MG PO TABS: 160.0000 mg | ORAL_TABLET | Freq: Every day | ORAL | Status: DC

## 2016-03-21 MED ORDER — FUROSEMIDE 40 MG PO TABS: ORAL_TABLET | ORAL | Status: DC

## 2016-03-21 MED ORDER — BENAZEPRIL HCL 40 MG PO TABS: ORAL_TABLET | ORAL | Status: DC

## 2016-03-21 MED ORDER — ALLOPURINOL 300 MG PO TABS: ORAL_TABLET | ORAL | Status: DC

## 2016-03-21 MED ORDER — NALTREXONE-BUPROPION HCL ER 8-90 MG PO TB12: ORAL_TABLET | ORAL | Status: DC

## 2016-03-21 MED ORDER — POTASSIUM CHLORIDE ER 10 MEQ PO TBCR: 10.0000 meq | EXTENDED_RELEASE_TABLET | Freq: Every day | ORAL | Status: DC

## 2016-03-21 NOTE — Patient Instructions (Signed)

## 2016-03-21 NOTE — Progress Notes (Signed)
Subjective:    Patient ID: Tammy Chavez, female    DOB: 1961-02-08, 55 y.o.   MRN: [id number]   Patient here today for follow up of chronic medical problems.  Outpatient Encounter Prescriptions as of 03/21/2016  Medication Sig  . allopurinol (ZYLOPRIM) 300 MG tablet TAKE 1 BY MOUTH DAILY  . atorvastatin (LIPITOR) 80 MG tablet TAKE 1 BY MOUTH DAILY  . benazepril (LOTENSIN) 40 MG tablet TAKE 1 BY MOUTH DAILY  . fenofibrate 160 MG tablet Take 1 tablet (160 mg total) by mouth daily.  . furosemide (LASIX) 40 MG tablet TAKE 1 BY MOUTH DAILY  . potassium chloride (KLOR-CON 10) 10 MEQ tablet TAKE 1 BY MOUTH TWICE DAILY (Patient taking differently: Take 10 mEq by mouth daily. TAKE 1 BY MOUTH TWICE DAILY)   No facility-administered encounter medications on file as of 03/21/2016.    Hypertension This is a chronic problem. The current episode started more than 1 year ago. The problem is unchanged. The problem is controlled. Associated symptoms include peripheral edema. Pertinent negatives include no chest pain or palpitations. Risk factors for coronary artery disease include diabetes mellitus, dyslipidemia and obesity. Past treatments include ACE inhibitors and diuretics. The current treatment provides moderate improvement. Compliance problems include diet and exercise.   Hyperlipidemia This is a chronic problem. The current episode started more than 1 year ago. The problem is uncontrolled. Recent lipid tests were reviewed and are high. Exacerbating diseases include obesity. She has no history of diabetes or hypothyroidism. Pertinent negatives include no chest pain. Current antihyperlipidemic treatment includes statins and fibric acid derivatives. The current treatment provides moderate improvement of lipids. Compliance problems include adherence to diet and adherence to exercise.  Risk factors for coronary artery disease include dyslipidemia, family history, hypertension and obesity.   Hypokalemia klor-con daily- no c/o cramps Peripheral edema Lasix helps keep swelling down Gout No recent flare-up    Review of Systems  Constitutional: Negative.   HENT: Negative.   Respiratory: Negative.   Cardiovascular: Negative.  Negative for chest pain and palpitations.  Genitourinary: Negative.   Neurological: Negative.   Psychiatric/Behavioral: Negative.   All other systems reviewed and are negative.      Objective:   Physical Exam  Constitutional: She is oriented to person, place, and time. She appears well-developed and well-nourished.  HENT:  Nose: Nose normal.  Mouth/Throat: Oropharynx is clear and moist.  Eyes: EOM are normal.  Neck: Trachea normal, normal range of motion and full passive range of motion without pain. Neck supple. No JVD present. Carotid bruit is not present. No thyromegaly present.  Cardiovascular: Normal rate, regular rhythm, normal heart sounds and intact distal pulses.  Exam reveals no gallop and no friction rub.   No murmur heard. Pulmonary/Chest: Effort normal and breath sounds normal.  Abdominal: Soft. Bowel sounds are normal. She exhibits no distension and no mass. There is no tenderness.  Musculoskeletal: Normal range of motion.  Lymphadenopathy:    She has no cervical adenopathy.  Neurological: She is alert and oriented to person, place, and time. She has normal reflexes.  Skin: Skin is warm and dry.  Psychiatric: She has a normal mood and affect. Her behavior is normal. Judgment and thought content normal.     BP 122/80 mmHg  Pulse 65  Temp(Src) 97.1 F (36.2 C) (Oral)  Ht '5\' 1"'$  (1.549 m)  Wt 245 lb (111.131 kg)  BMI 46.32 kg/m2  LMP 11/03/2013     Assessment & Plan:  1. Essential  hypertension Do not add salt to diet - benazepril (LOTENSIN) 40 MG tablet; TAKE 1 BY MOUTH DAILY  Dispense: 90 tablet; Refill: 1 - CMP14+EGFR  2. Hyperlipidemia with target LDL less than 100 Low fat diet - fenofibrate 160 MG tablet; Take 1  tablet (160 mg total) by mouth daily.  Dispense: 30 tablet; Refill: 5 - atorvastatin (LIPITOR) 80 MG tablet; TAKE 1 BY MOUTH DAILY  Dispense: 90 tablet; Refill: 1 - Lipid panel  3. Idiopathic chronic gout of multiple sites without tophus - allopurinol (ZYLOPRIM) 300 MG tablet; TAKE 1 BY MOUTH DAILY  Dispense: 90 tablet; Refill: 1  4. Hypokalemia  - potassium chloride (KLOR-CON 10) 10 MEQ tablet; Take 1 tablet (10 mEq total) by mouth daily. TAKE 1 BY MOUTH TWICE DAILY  Dispense: 90 tablet; Refill: 1  5. Peripheral edema Elevate legs when sitting - furosemide (LASIX) 40 MG tablet; TAKE 1 BY MOUTH DAILY  Dispense: 90 tablet; Refill: 1  6. Obesity exercsie at least 3x a week - Naltrexone-Bupropion HCl ER (CONTRAVE) 8-90 MG TB12; 1 tablet Po Qam x7d ; then 1 tab po BID x 7 d; then 2 tab in AM and 1 in PM X7d; Then 2 po BID from then on  Dispense: 120 tablet; Refill: 2    Labs pending Health maintenance reviewed Diet and exercise encouraged Continue all meds Follow up  In 6 months   [place], FNP

## 2016-03-22 LAB — CMP14+EGFR
A/G RATIO: 1.4 (ref 1.2–2.2)
ALBUMIN: 4.6 g/dL (ref 3.5–5.5)
ALK PHOS: 42 IU/L (ref 39–117)
ALT: 33 IU/L — ABNORMAL HIGH (ref 0–32)
AST: 35 IU/L (ref 0–40)
BILIRUBIN TOTAL: 0.2 mg/dL (ref 0.0–1.2)
BUN / CREAT RATIO: 31 — AB (ref 9–23)
BUN: 34 mg/dL — AB (ref 6–24)
CHLORIDE: 102 mmol/L (ref 96–106)
CO2: 23 mmol/L (ref 18–29)
Calcium: 10 mg/dL (ref 8.7–10.2)
Creatinine, Ser: 1.1 mg/dL — ABNORMAL HIGH (ref 0.57–1.00)
GFR calc Af Amer: 66 mL/min/{1.73_m2} (ref >59)
GFR calc non Af Amer: 57 mL/min/{1.73_m2} — ABNORMAL LOW (ref >59)
GLOBULIN, TOTAL: 3.3 g/dL (ref 1.5–4.5)
GLUCOSE: 94 mg/dL (ref 65–99)
POTASSIUM: 4.8 mmol/L (ref 3.5–5.2)
SODIUM: 144 mmol/L (ref 134–144)
Total Protein: 7.9 g/dL (ref 6.0–8.5)

## 2016-03-22 LAB — LIPID PANEL
CHOLESTEROL TOTAL: 180 mg/dL (ref 100–199)
Chol/HDL Ratio: 3.8 ratio units (ref 0.0–4.4)
HDL: 47 mg/dL (ref >39)
LDL Calculated: 102 mg/dL — ABNORMAL HIGH (ref 0–99)
Triglycerides: 155 mg/dL — ABNORMAL HIGH (ref 0–149)
VLDL Cholesterol Cal: 31 mg/dL (ref 5–40)

## 2016-06-19 ENCOUNTER — Other Ambulatory Visit: Payer: Self-pay | Admitting: Nurse Practitioner

## 2016-06-19 MED ORDER — INDOMETHACIN 50 MG PO CAPS: 50.0000 mg | ORAL_CAPSULE | Freq: Two times a day (BID) | ORAL | 0 refills | Status: DC

## 2016-06-19 NOTE — Telephone Encounter (Signed)
Indomethacin ordered.

## 2016-06-19 NOTE — Telephone Encounter (Signed)
Pt aware.

## 2016-09-22 ENCOUNTER — Encounter: Payer: BLUE CROSS/BLUE SHIELD | Admitting: Nurse Practitioner

## 2016-10-24 ENCOUNTER — Encounter: Payer: Self-pay | Admitting: Nurse Practitioner

## 2016-10-24 ENCOUNTER — Ambulatory Visit (INDEPENDENT_AMBULATORY_CARE_PROVIDER_SITE_OTHER): Payer: BLUE CROSS/BLUE SHIELD | Admitting: Nurse Practitioner

## 2016-10-24 ENCOUNTER — Ambulatory Visit (INDEPENDENT_AMBULATORY_CARE_PROVIDER_SITE_OTHER): Payer: BLUE CROSS/BLUE SHIELD

## 2016-10-24 VITALS — BP 129/76 | HR 68 | Temp 97.5°F | Ht 61.0 in | Wt 253.0 lb

## 2016-10-24 DIAGNOSIS — I10 Essential (primary) hypertension: Secondary | ICD-10-CM

## 2016-10-24 DIAGNOSIS — Z0000 Encounter for general adult medical examination without abnormal findings: Secondary | ICD-10-CM

## 2016-10-24 DIAGNOSIS — E876 Hypokalemia: Secondary | ICD-10-CM

## 2016-10-24 DIAGNOSIS — Z1211 Encounter for screening for malignant neoplasm of colon: Secondary | ICD-10-CM

## 2016-10-24 DIAGNOSIS — M1A09X0 Idiopathic chronic gout, multiple sites, without tophus (tophi): Secondary | ICD-10-CM

## 2016-10-24 DIAGNOSIS — E785 Hyperlipidemia, unspecified: Secondary | ICD-10-CM

## 2016-10-24 DIAGNOSIS — Z1212 Encounter for screening for malignant neoplasm of rectum: Secondary | ICD-10-CM

## 2016-10-24 DIAGNOSIS — R609 Edema, unspecified: Secondary | ICD-10-CM

## 2016-10-24 DIAGNOSIS — IMO0001 Reserved for inherently not codable concepts without codable children: Secondary | ICD-10-CM

## 2016-10-24 LAB — URINALYSIS, COMPLETE
Bilirubin, UA: NEGATIVE
Glucose, UA: NEGATIVE
Ketones, UA: NEGATIVE
NITRITE UA: NEGATIVE
PH UA: 5 (ref 5.0–7.5)
Protein, UA: NEGATIVE
Specific Gravity, UA: 1.02 (ref 1.005–1.030)
UUROB: 0.2 mg/dL (ref 0.2–1.0)

## 2016-10-24 LAB — CMP14+EGFR
ALBUMIN: 4.5 g/dL (ref 3.5–5.5)
ALT: 25 IU/L (ref 0–32)
AST: 22 IU/L (ref 0–40)
Albumin/Globulin Ratio: 1.4 (ref 1.2–2.2)
Alkaline Phosphatase: 62 IU/L (ref 39–117)
BUN / CREAT RATIO: 28 — AB (ref 9–23)
BUN: 25 mg/dL — AB (ref 6–24)
Bilirubin Total: 0.4 mg/dL (ref 0.0–1.2)
CALCIUM: 10.3 mg/dL — AB (ref 8.7–10.2)
CO2: 25 mmol/L (ref 18–29)
CREATININE: 0.88 mg/dL (ref 0.57–1.00)
Chloride: 100 mmol/L (ref 96–106)
GFR, EST AFRICAN AMERICAN: 86 mL/min/{1.73_m2} (ref >59)
GFR, EST NON AFRICAN AMERICAN: 74 mL/min/{1.73_m2} (ref >59)
GLUCOSE: 92 mg/dL (ref 65–99)
Globulin, Total: 3.2 g/dL (ref 1.5–4.5)
Potassium: 4.6 mmol/L (ref 3.5–5.2)
Sodium: 143 mmol/L (ref 134–144)
TOTAL PROTEIN: 7.7 g/dL (ref 6.0–8.5)

## 2016-10-24 LAB — LIPID PANEL
CHOLESTEROL TOTAL: 183 mg/dL (ref 100–199)
Chol/HDL Ratio: 4.1 ratio units (ref 0.0–4.4)
HDL: 45 mg/dL (ref >39)
LDL CALC: 105 mg/dL — AB (ref 0–99)
Triglycerides: 165 mg/dL — ABNORMAL HIGH (ref 0–149)
VLDL CHOLESTEROL CAL: 33 mg/dL (ref 5–40)

## 2016-10-24 LAB — MICROSCOPIC EXAMINATION
Epithelial Cells (non renal): >10 /hpf — AB (ref 0–10)
Renal Epithel, UA: NONE SEEN /hpf

## 2016-10-24 MED ORDER — POTASSIUM CHLORIDE ER 10 MEQ PO TBCR: 10.0000 meq | EXTENDED_RELEASE_TABLET | Freq: Every day | ORAL | 1 refills | Status: DC

## 2016-10-24 MED ORDER — BENAZEPRIL HCL 40 MG PO TABS: ORAL_TABLET | ORAL | 1 refills | Status: DC

## 2016-10-24 MED ORDER — ALLOPURINOL 300 MG PO TABS: ORAL_TABLET | ORAL | 1 refills | Status: DC

## 2016-10-24 MED ORDER — ATORVASTATIN CALCIUM 80 MG PO TABS: ORAL_TABLET | ORAL | 1 refills | Status: DC

## 2016-10-24 MED ORDER — FUROSEMIDE 40 MG PO TABS: ORAL_TABLET | ORAL | 1 refills | Status: DC

## 2016-10-24 MED ORDER — FENOFIBRATE 160 MG PO TABS: 160.0000 mg | ORAL_TABLET | Freq: Every day | ORAL | 1 refills | Status: DC

## 2016-10-24 NOTE — Progress Notes (Signed)
Subjective:    Patient ID: Tammy Chavez, female    DOB: May 10, 1961, 55 y.o.   MRN: [id number]   Patient here today for annual physical exam and follow up of chronic medical problems.   Outpatient Encounter Prescriptions as of 10/24/2016  Medication Sig  . allopurinol (ZYLOPRIM) 300 MG tablet TAKE 1 BY MOUTH DAILY  . atorvastatin (LIPITOR) 80 MG tablet TAKE 1 BY MOUTH DAILY  . benazepril (LOTENSIN) 40 MG tablet TAKE 1 BY MOUTH DAILY  . fenofibrate 160 MG tablet Take 1 tablet (160 mg total) by mouth daily.  . indomethacin (INDOCIN) 50 MG capsule Take 1 capsule (50 mg total) by mouth 2 (two) times daily with a meal.  . potassium chloride (KLOR-CON 10) 10 MEQ tablet Take 1 tablet (10 mEq total) by mouth daily. TAKE 1 BY MOUTH TWICE DAILY  . furosemide (LASIX) 40 MG tablet TAKE 1 BY MOUTH DAILY (Patient not taking: Reported on 10/24/2016)  . [DISCONTINUED] Naltrexone-Bupropion HCl ER (CONTRAVE) 8-90 MG TB12 1 tablet Po Qam x7d ; then 1 tab po BID x 7 d; then 2 tab in AM and 1 in PM X7d; Then 2 po BID from then on   No facility-administered encounter medications on file as of 10/24/2016.    * Has cystic lesions in scalp- have been there for years- just wants them looked at.  Hypertension  This is a chronic problem. The current episode started more than 1 year ago. The problem is unchanged. The problem is controlled. Associated symptoms include peripheral edema. Pertinent negatives include no chest pain or palpitations. Risk factors for coronary artery disease include diabetes mellitus, dyslipidemia and obesity. Past treatments include ACE inhibitors and diuretics. The current treatment provides moderate improvement. Compliance problems include diet and exercise.   Hyperlipidemia  This is a chronic problem. The current episode started more than 1 year ago. The problem is uncontrolled. Recent lipid tests were reviewed and are high. Exacerbating diseases include obesity. She has no history of  diabetes or hypothyroidism. Pertinent negatives include no chest pain. Current antihyperlipidemic treatment includes statins and fibric acid derivatives. The current treatment provides moderate improvement of lipids. Compliance problems include adherence to diet and adherence to exercise.  Risk factors for coronary artery disease include dyslipidemia, family history, hypertension and obesity.  Hypokalemia klor-con daily- no c/o cramps Peripheral edema Lasix helps keep swelling down Gout No recent flare-up    Review of Systems  Constitutional: Negative.   HENT: Negative.   Respiratory: Negative.   Cardiovascular: Negative.  Negative for chest pain and palpitations.  Genitourinary: Negative.   Neurological: Negative.   Psychiatric/Behavioral: Negative.   All other systems reviewed and are negative.      Objective:   Physical Exam  Constitutional: She is oriented to person, place, and time. She appears well-developed and well-nourished.  HENT:  Nose: Nose normal.  Mouth/Throat: Oropharynx is clear and moist.  Eyes: EOM are normal.  Neck: Trachea normal, normal range of motion and full passive range of motion without pain. Neck supple. No JVD present. Carotid bruit is not present. No thyromegaly present.  Cardiovascular: Normal rate, regular rhythm, normal heart sounds and intact distal pulses.  Exam reveals no gallop and no friction rub.   No murmur heard. Pulmonary/Chest: Effort normal and breath sounds normal.  Abdominal: Soft. Bowel sounds are normal. She exhibits no distension and no mass. There is no tenderness.  Musculoskeletal: Normal range of motion.  Lymphadenopathy:    She has no cervical adenopathy.  Neurological: She is alert and oriented to person, place, and time. She has normal reflexes.  Skin: Skin is warm and dry.  Psychiatric: She has a normal mood and affect. Her behavior is normal. Judgment and thought content normal.     BP 129/76   Pulse 68   Temp 97.5  F (36.4 C) (Oral)   Ht '5\' 1"'$  (1.549 m)   Wt 253 lb (114.8 kg)   LMP 11/03/2013   BMI 47.80 kg/m   EKG- NSR- [name] Hassell Done, FNP  Chest xray- no abnormal findings-Preliminary reading by Ronnald Collum, FNP  [facility]     Assessment & Plan:  1. Annual physical exam - Urinalysis, Complete  2. Essential hypertension Low sodium diet - CMP14+EGFR - benazepril (LOTENSIN) 40 MG tablet; TAKE 1 BY MOUTH DAILY  Dispense: 90 tablet; Refill: 1 - EKG 12-Lead - DG Chest 2 View; Future  3. Hyperlipidemia with target LDL less than 100 Low fat diet - Lipid panel - fenofibrate 160 MG tablet; Take 1 tablet (160 mg total) by mouth daily.  Dispense: 90 tablet; Refill: 1 - atorvastatin (LIPITOR) 80 MG tablet; TAKE 1 BY MOUTH DAILY  Dispense: 90 tablet; Refill: 1  4. Hypokalemia - potassium chloride (KLOR-CON 10) 10 MEQ tablet; Take 1 tablet (10 mEq total) by mouth daily. TAKE 1 BY MOUTH TWICE DAILY  Dispense: 90 tablet; Refill: 1  5. Class 3 obesity with serious comorbidity and body mass index (BMI) of 45.0 to 49.9 in adult, unspecified obesity type ([place]) Discussed diet and exercise for person with BMI >25 Will recheck weight in 3-6 months  6. Peripheral edema elevat elegs when sitting - furosemide (LASIX) 40 MG tablet; TAKE 1 BY MOUTH DAILY  Dispense: 90 tablet; Refill: 1  7. Idiopathic chronic gout of multiple sites without tophus - allopurinol (ZYLOPRIM) 300 MG tablet; TAKE 1 BY MOUTH DAILY  Dispense: 90 tablet; Refill: 1  8. Encounter for colorectal cancer screening - Fecal occult blood, imunochemical; Future   Labs pending Health maintenance reviewed Diet and exercise encouraged Continue all meds Follow up  In 6 months  [place], FNP

## 2016-10-24 NOTE — Patient Instructions (Signed)

## 2017-03-27 ENCOUNTER — Ambulatory Visit: Payer: BLUE CROSS/BLUE SHIELD | Admitting: Nurse Practitioner

## 2017-05-01 ENCOUNTER — Ambulatory Visit (INDEPENDENT_AMBULATORY_CARE_PROVIDER_SITE_OTHER): Payer: BLUE CROSS/BLUE SHIELD | Admitting: Nurse Practitioner

## 2017-05-01 ENCOUNTER — Encounter: Payer: Self-pay | Admitting: Nurse Practitioner

## 2017-05-01 VITALS — BP 137/89 | HR 70 | Temp 98.1°F | Ht 61.0 in | Wt 254.0 lb

## 2017-05-01 DIAGNOSIS — Z6841 Body Mass Index (BMI) 40.0 and over, adult: Secondary | ICD-10-CM | POA: Diagnosis not present

## 2017-05-01 DIAGNOSIS — E669 Obesity, unspecified: Secondary | ICD-10-CM

## 2017-05-01 DIAGNOSIS — E785 Hyperlipidemia, unspecified: Secondary | ICD-10-CM

## 2017-05-01 DIAGNOSIS — IMO0001 Reserved for inherently not codable concepts without codable children: Secondary | ICD-10-CM

## 2017-05-01 DIAGNOSIS — E876 Hypokalemia: Secondary | ICD-10-CM | POA: Diagnosis not present

## 2017-05-01 DIAGNOSIS — R609 Edema, unspecified: Secondary | ICD-10-CM

## 2017-05-01 DIAGNOSIS — M1A09X0 Idiopathic chronic gout, multiple sites, without tophus (tophi): Secondary | ICD-10-CM

## 2017-05-01 DIAGNOSIS — R600 Localized edema: Secondary | ICD-10-CM

## 2017-05-01 DIAGNOSIS — I10 Essential (primary) hypertension: Secondary | ICD-10-CM | POA: Diagnosis not present

## 2017-05-01 MED ORDER — POTASSIUM CHLORIDE ER 10 MEQ PO TBCR: 10.0000 meq | EXTENDED_RELEASE_TABLET | Freq: Every day | ORAL | 1 refills | Status: DC

## 2017-05-01 MED ORDER — FUROSEMIDE 40 MG PO TABS: ORAL_TABLET | ORAL | 1 refills | Status: DC

## 2017-05-01 MED ORDER — BENAZEPRIL HCL 40 MG PO TABS: ORAL_TABLET | ORAL | 1 refills | Status: DC

## 2017-05-01 MED ORDER — INDOMETHACIN 50 MG PO CAPS: 50.0000 mg | ORAL_CAPSULE | Freq: Two times a day (BID) | ORAL | 2 refills | Status: DC

## 2017-05-01 MED ORDER — FENOFIBRATE 160 MG PO TABS: 160.0000 mg | ORAL_TABLET | Freq: Every day | ORAL | 1 refills | Status: DC

## 2017-05-01 MED ORDER — ATORVASTATIN CALCIUM 80 MG PO TABS: ORAL_TABLET | ORAL | 1 refills | Status: DC

## 2017-05-01 NOTE — Addendum Note (Signed)
Addended by: Cleda DaubUCKER, Madilynne Mullan G on: 05/01/2017 04:30 PM   Modules accepted: Orders

## 2017-05-01 NOTE — Addendum Note (Signed)
Addended by: Bennie PieriniMARTIN, [name] on: 05/01/2017 12:49 PM   Modules accepted: Orders

## 2017-05-01 NOTE — Patient Instructions (Signed)

## 2017-05-01 NOTE — Progress Notes (Signed)
Subjective:    Patient ID: Tammy Chavez, female    DOB: 08-Jul-1961, 56 y.o.   MRN: [id number]  HPI MCKINLEY ADELSTEIN is here today for follow up of chronic medical problem.  Outpatient Encounter Prescriptions as of 05/01/2017  Medication Sig  . allopurinol (ZYLOPRIM) 300 MG tablet TAKE 1 BY MOUTH DAILY  . atorvastatin (LIPITOR) 80 MG tablet TAKE 1 BY MOUTH DAILY  . benazepril (LOTENSIN) 40 MG tablet TAKE 1 BY MOUTH DAILY  . fenofibrate 160 MG tablet Take 1 tablet (160 mg total) by mouth daily.  . furosemide (LASIX) 40 MG tablet TAKE 1 BY MOUTH DAILY  . potassium chloride (KLOR-CON 10) 10 MEQ tablet Take 1 tablet (10 mEq total) by mouth daily.  . indomethacin (INDOCIN) 50 MG capsule Take 1 capsule (50 mg total) by mouth 2 (two) times daily with a meal. (Patient not taking: Reported on 05/01/2017)   No facility-administered encounter medications on file as of 05/01/2017.     1. Essential hypertension  Patient taking benazepril for management. Patient does not check blood pressure at home.  2. Hyperlipidemia with target LDL less than 100  Symptoms managed with atorvastatin.  3. Idiopathic chronic gout of multiple sites without tophus  Patient taking allopurinol and having no acute attacks.  4. Hypokalemia  Patient takes a potassium supplement for management.  No current issues.  5. Peripheral edema  Patient takes daily Lasix and elevates legs when sitting.  6. Class 3 obesity with serious comorbidity and body mass index (BMI) of 45.0 to 49.9 in adult, unspecified obesity type (H[facility])  No recent weight gain or loss.    New complaints: None today.    Review of Systems  Constitutional: Negative for activity change, appetite change and fatigue.  Respiratory: Negative for cough and shortness of breath.   Cardiovascular: Negative for chest pain and palpitations.  Gastrointestinal: Negative for abdominal distention and abdominal pain.  Neurological: Negative for dizziness and  headaches.  All other systems reviewed and are negative.      Objective:   Physical Exam  Constitutional: She is oriented to person, place, and time. She appears well-developed and well-nourished. No distress.  HENT:  Head: Normocephalic.  Right Ear: External ear normal.  Left Ear: External ear normal.  Nose: Nose normal.  Mouth/Throat: Oropharynx is clear and moist.  Eyes: Pupils are equal, round, and reactive to light.  Neck: Normal range of motion. Neck supple. No JVD present. No thyromegaly present.  Cardiovascular: Normal rate, regular rhythm, normal heart sounds and intact distal pulses.   No murmur heard. Pulmonary/Chest: Effort normal and breath sounds normal. No respiratory distress. She has no wheezes.  Abdominal: Soft. Bowel sounds are normal. She exhibits no distension. There is no tenderness.  Musculoskeletal: Normal range of motion.  Lymphadenopathy:    She has no cervical adenopathy.  Neurological: She is alert and oriented to person, place, and time.  Skin: Skin is warm and dry.  Psychiatric: She has a normal mood and affect. Her behavior is normal. Judgment and thought content normal.   BP 137/89   Pulse 70   Temp 98.1 F (36.7 C) (Oral)   Ht 5\' 1"  (1.549 m)   Wt 254 lb (115.2 kg)   LMP 11/03/2013   BMI 47.99 kg/m      Assessment & Plan:  1. Essential hypertension Low sodium diet - benazepril (LOTENSIN) 40 MG tablet; TAKE 1 BY MOUTH DAILY  Dispense: 90 tablet; Refill: 1  2. Hyperlipidemia with  target LDL less than 100 Low fat diet - fenofibrate 160 MG tablet; Take 1 tablet (160 mg total) by mouth daily.  Dispense: 90 tablet; Refill: 1 - atorvastatin (LIPITOR) 80 MG tablet; TAKE 1 BY MOUTH DAILY  Dispense: 90 tablet; Refill: 1  3. Idiopathic chronic gout of multiple sites without tophus  4. Hypokalemia - potassium chloride (KLOR-CON 10) 10 MEQ tablet; Take 1 tablet (10 mEq total) by mouth daily.  Dispense: 90 tablet; Refill: 1  5. Peripheral  edema elvate legs when sitting - furosemide (LASIX) 40 MG tablet; TAKE 1 BY MOUTH DAILY  Dispense: 90 tablet; Refill: 1  6. Class 3 obesity with serious comorbidity and body mass index (BMI) of 45.0 to 49.9 in adult, unspecified obesity type (HCC) Discussed diet and exercise for person with BMI >25 Will recheck weight in 3-6 months    Labs pending Health maintenance reviewed Diet and exercise encouraged Continue all meds Follow up  In 6 months   [name] Daphine DeutscherMartin, FNP

## 2017-05-02 LAB — CMP14+EGFR
ALT: 29 IU/L (ref 0–32)
AST: 32 IU/L (ref 0–40)
Albumin/Globulin Ratio: 1.6 (ref 1.2–2.2)
Albumin: 4.5 g/dL (ref 3.5–5.5)
Alkaline Phosphatase: 46 IU/L (ref 39–117)
BUN/Creatinine Ratio: 37 — ABNORMAL HIGH (ref 9–23)
BUN: 31 mg/dL — AB (ref 6–24)
Bilirubin Total: 0.2 mg/dL (ref 0.0–1.2)
CALCIUM: 9.7 mg/dL (ref 8.7–10.2)
CO2: 22 mmol/L (ref 20–29)
Chloride: 104 mmol/L (ref 96–106)
Creatinine, Ser: 0.84 mg/dL (ref 0.57–1.00)
GFR calc Af Amer: 90 mL/min/{1.73_m2} (ref >59)
GFR, EST NON AFRICAN AMERICAN: 78 mL/min/{1.73_m2} (ref >59)
GLUCOSE: 100 mg/dL — AB (ref 65–99)
Globulin, Total: 2.9 g/dL (ref 1.5–4.5)
Potassium: 4.6 mmol/L (ref 3.5–5.2)
Sodium: 144 mmol/L (ref 134–144)
TOTAL PROTEIN: 7.4 g/dL (ref 6.0–8.5)

## 2017-05-02 LAB — LIPID PANEL
CHOL/HDL RATIO: 4.2 ratio (ref 0.0–4.4)
Cholesterol, Total: 194 mg/dL (ref 100–199)
HDL: 46 mg/dL (ref >39)
LDL Calculated: 109 mg/dL — ABNORMAL HIGH (ref 0–99)
TRIGLYCERIDES: 193 mg/dL — AB (ref 0–149)
VLDL CHOLESTEROL CAL: 39 mg/dL (ref 5–40)

## 2017-06-07 IMAGING — DX DG CHEST 2V
2 series · 2 of 2 positions shown · non-contrast
Comparison: None.

CLINICAL DATA: Cough

EXAM:
CHEST  2 VIEW

[chest pa]
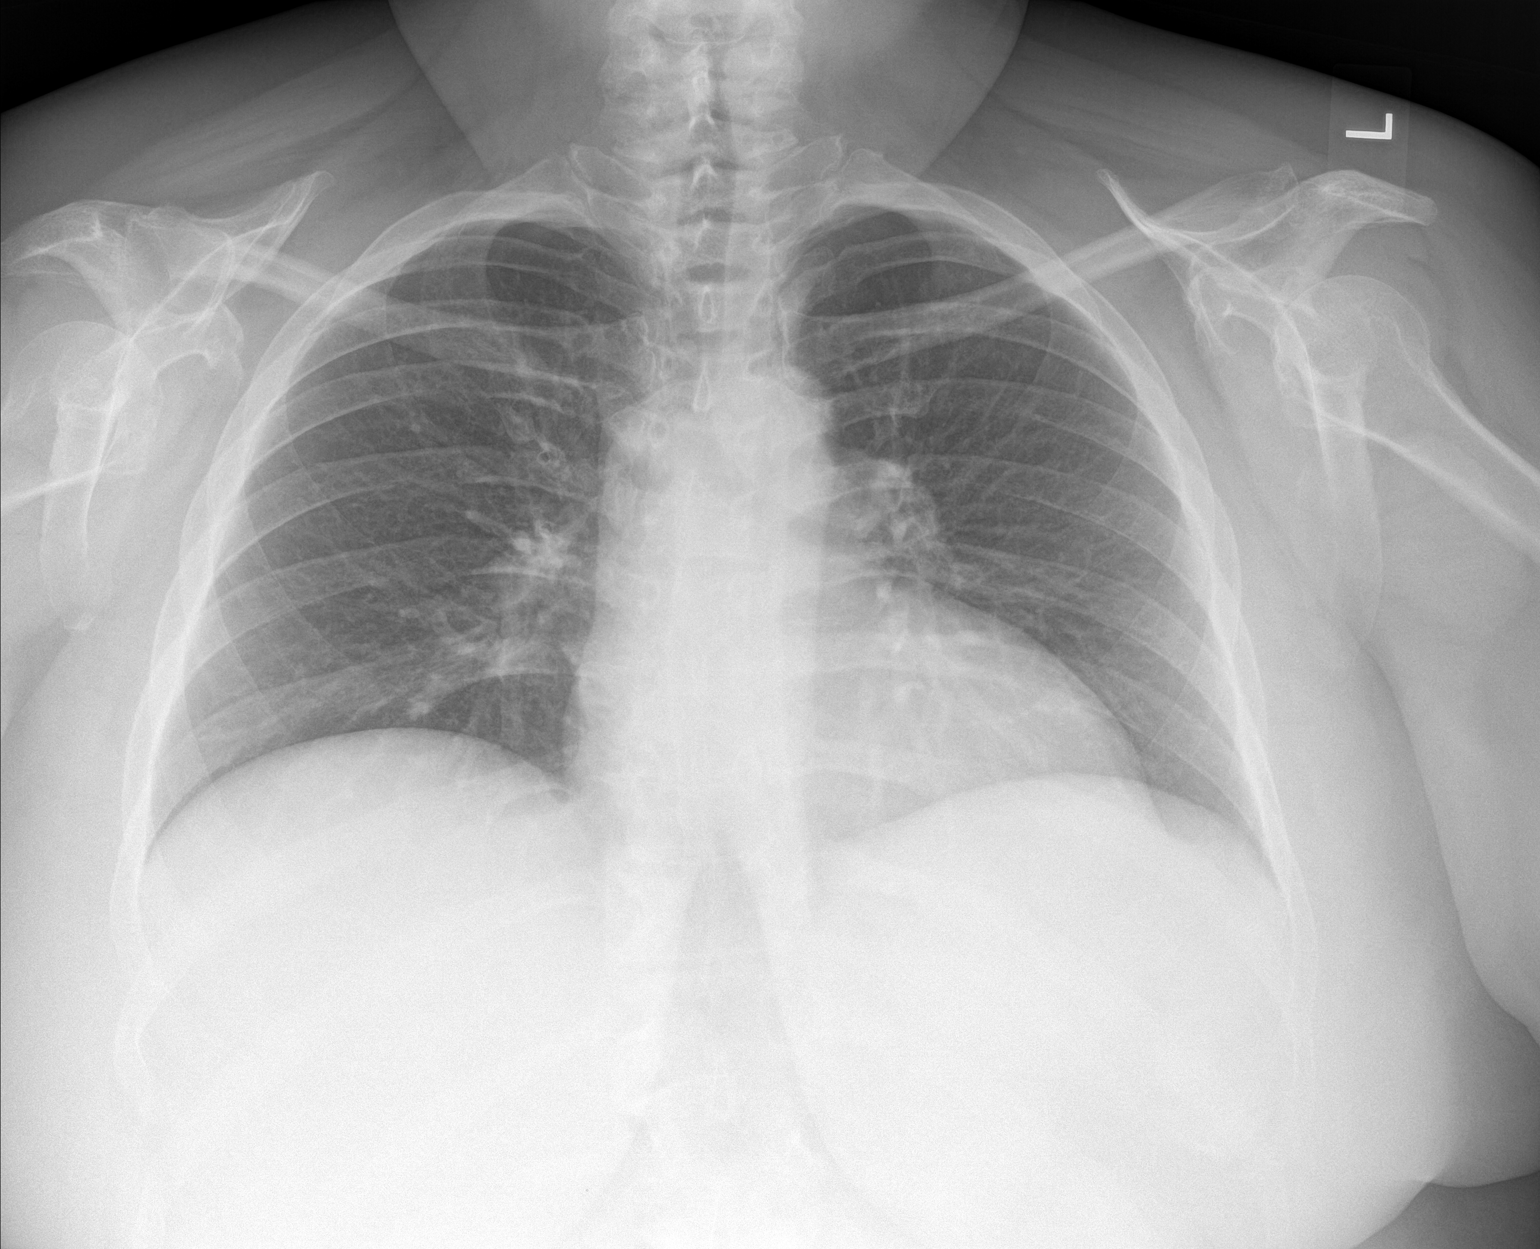

[chest lat]
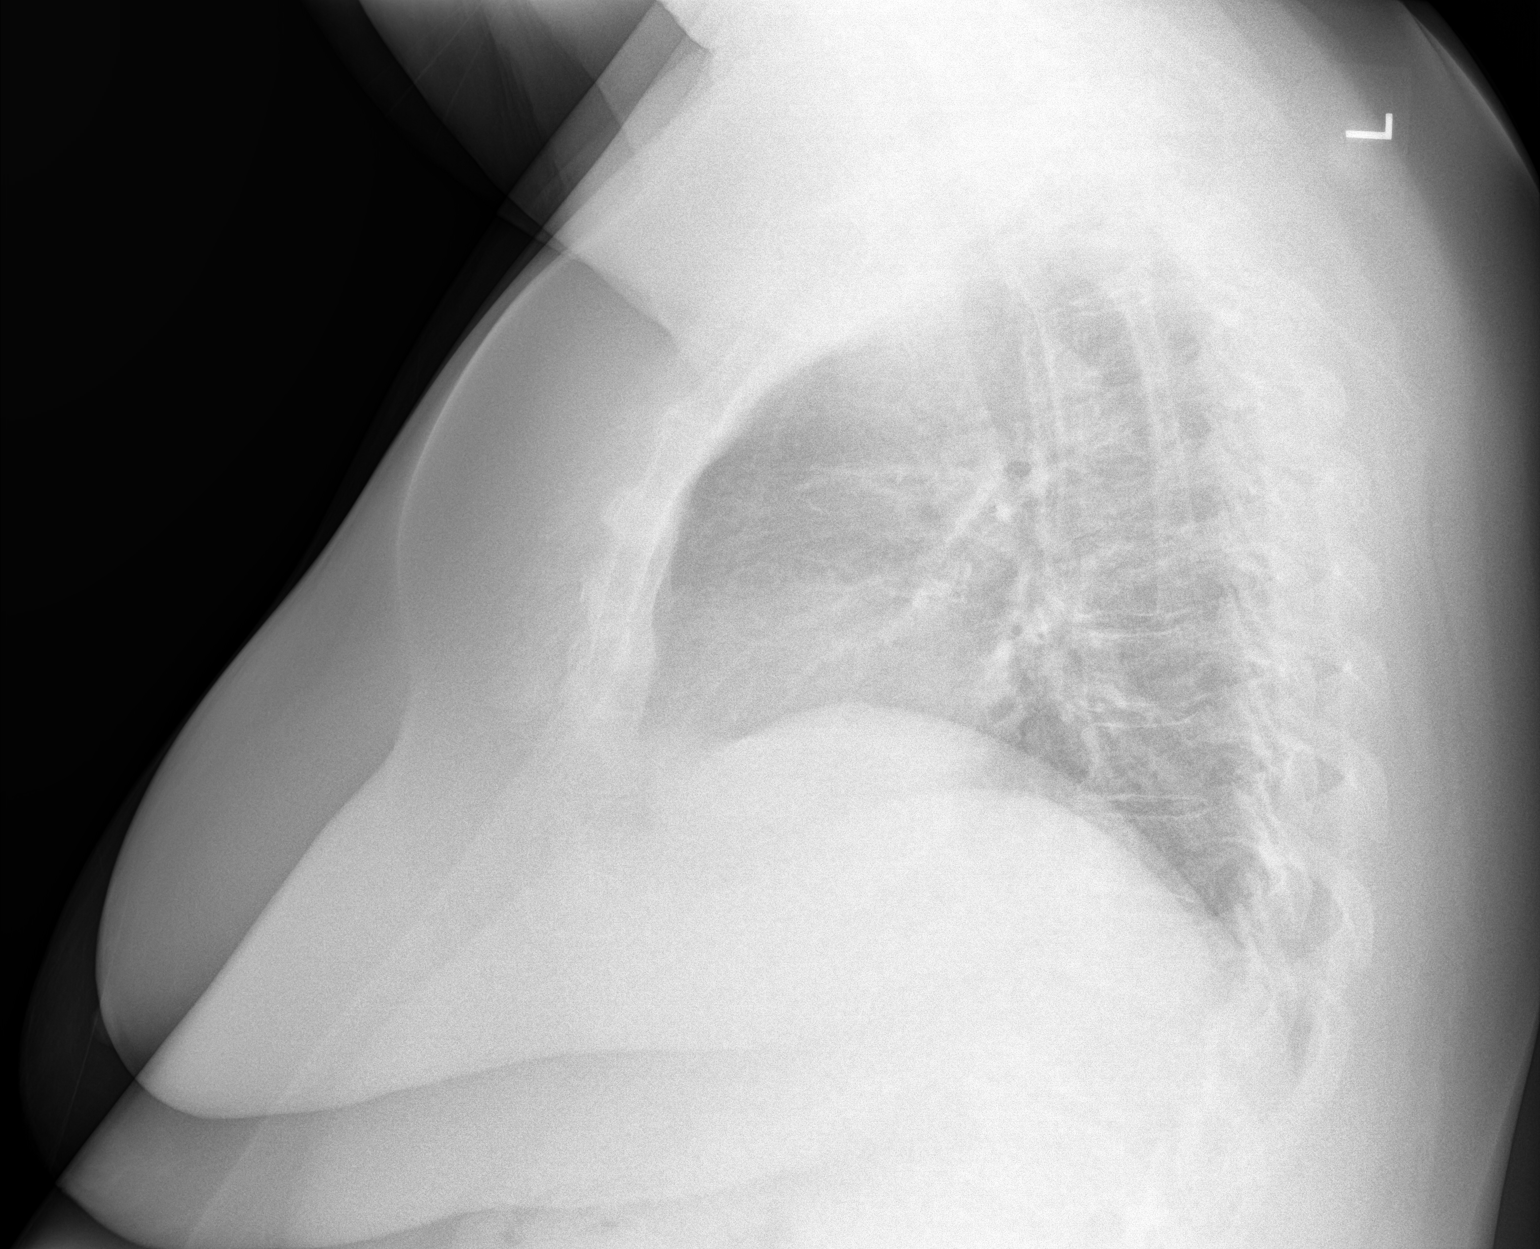

[2 of 2 positions shown; findings below may reference images not displayed]

FINDINGS: The heart size and mediastinal contours are within normal limits.
Both lungs are clear. No pleural effusion or pneumothorax.

Skeletal structures are unremarkable.
IMPRESSION: No active cardiopulmonary disease.

## 2017-08-03 ENCOUNTER — Ambulatory Visit (INDEPENDENT_AMBULATORY_CARE_PROVIDER_SITE_OTHER): Payer: BLUE CROSS/BLUE SHIELD | Admitting: Family Medicine

## 2017-08-03 ENCOUNTER — Ambulatory Visit (INDEPENDENT_AMBULATORY_CARE_PROVIDER_SITE_OTHER): Payer: BLUE CROSS/BLUE SHIELD

## 2017-08-03 ENCOUNTER — Encounter: Payer: Self-pay | Admitting: Family Medicine

## 2017-08-03 VITALS — BP 144/76 | HR 68 | Temp 98.7°F | Ht 61.0 in | Wt 262.0 lb

## 2017-08-03 DIAGNOSIS — R05 Cough: Secondary | ICD-10-CM

## 2017-08-03 DIAGNOSIS — M542 Cervicalgia: Secondary | ICD-10-CM | POA: Diagnosis not present

## 2017-08-03 DIAGNOSIS — R059 Cough, unspecified: Secondary | ICD-10-CM

## 2017-08-03 MED ORDER — GUAIFENESIN-CODEINE 100-10 MG/5ML PO SOLN: 5.0000 mL | Freq: Four times a day (QID) | ORAL | 0 refills | Status: DC | PRN

## 2017-08-03 MED ORDER — NAPROXEN 500 MG PO TABS: 500.0000 mg | ORAL_TABLET | Freq: Two times a day (BID) | ORAL | 0 refills | Status: DC
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
## 2017-08-30 ENCOUNTER — Other Ambulatory Visit: Payer: Self-pay | Admitting: Nurse Practitioner

## 2017-08-30 DIAGNOSIS — M1A09X0 Idiopathic chronic gout, multiple sites, without tophus (tophi): Secondary | ICD-10-CM

## 2017-11-06 ENCOUNTER — Ambulatory Visit (INDEPENDENT_AMBULATORY_CARE_PROVIDER_SITE_OTHER): Payer: BLUE CROSS/BLUE SHIELD | Admitting: Nurse Practitioner

## 2017-11-06 ENCOUNTER — Encounter: Payer: Self-pay | Admitting: Nurse Practitioner

## 2017-11-06 VITALS — BP 132/85 | HR 73 | Temp 96.8°F | Ht 61.0 in | Wt 261.0 lb

## 2017-11-06 DIAGNOSIS — Z6841 Body Mass Index (BMI) 40.0 and over, adult: Secondary | ICD-10-CM | POA: Diagnosis not present

## 2017-11-06 DIAGNOSIS — R609 Edema, unspecified: Secondary | ICD-10-CM

## 2017-11-06 DIAGNOSIS — E876 Hypokalemia: Secondary | ICD-10-CM

## 2017-11-06 DIAGNOSIS — I10 Essential (primary) hypertension: Secondary | ICD-10-CM | POA: Diagnosis not present

## 2017-11-06 DIAGNOSIS — M1A09X0 Idiopathic chronic gout, multiple sites, without tophus (tophi): Secondary | ICD-10-CM

## 2017-11-06 DIAGNOSIS — E785 Hyperlipidemia, unspecified: Secondary | ICD-10-CM | POA: Diagnosis not present

## 2017-11-06 MED ORDER — ATORVASTATIN CALCIUM 80 MG PO TABS: ORAL_TABLET | ORAL | 1 refills | Status: DC

## 2017-11-06 MED ORDER — ALLOPURINOL 300 MG PO TABS: ORAL_TABLET | ORAL | 1 refills | Status: DC

## 2017-11-06 MED ORDER — FUROSEMIDE 40 MG PO TABS: ORAL_TABLET | ORAL | 1 refills | Status: DC

## 2017-11-06 MED ORDER — BENAZEPRIL HCL 40 MG PO TABS: ORAL_TABLET | ORAL | 1 refills | Status: DC

## 2017-11-06 MED ORDER — FENOFIBRATE 160 MG PO TABS: 160.0000 mg | ORAL_TABLET | Freq: Every day | ORAL | 1 refills | Status: DC

## 2017-11-06 MED ORDER — POTASSIUM CHLORIDE ER 10 MEQ PO TBCR: 10.0000 meq | EXTENDED_RELEASE_TABLET | Freq: Every day | ORAL | 1 refills | Status: DC

## 2017-11-06 MED ORDER — INDOMETHACIN 50 MG PO CAPS: 50.0000 mg | ORAL_CAPSULE | Freq: Two times a day (BID) | ORAL | 2 refills | Status: DC

## 2017-11-06 NOTE — Progress Notes (Signed)
Subjective:    Patient ID: Tammy Chavez, female    DOB: [birth year], 57 y.o.   MRN: [id number]  HPI   Tammy Chavez is here today for follow up of chronic medical problem.  Outpatient Encounter Medications as of 11/06/2017  Medication Sig  . allopurinol (ZYLOPRIM) 300 MG tablet TAKE ONE TABLET BY MOUTH ONCE DAILY  . atorvastatin (LIPITOR) 80 MG tablet TAKE 1 BY MOUTH DAILY  . benazepril (LOTENSIN) 40 MG tablet TAKE 1 BY MOUTH DAILY  . fenofibrate 160 MG tablet Take 1 tablet (160 mg total) by mouth daily.  . furosemide (LASIX) 40 MG tablet TAKE 1 BY MOUTH DAILY  . guaiFENesin-codeine 100-10 MG/5ML syrup Take 5 mLs by mouth every 6 (six) hours as needed for cough.  . indomethacin (INDOCIN) 50 MG capsule Take 1 capsule (50 mg total) by mouth 2 (two) times daily with a meal.  . naproxen (NAPROSYN) 500 MG tablet Take 1 tablet (500 mg total) by mouth 2 (two) times daily with a meal.  . potassium chloride (KLOR-CON 10) 10 MEQ tablet Take 1 tablet (10 mEq total) by mouth daily.     1. Essential hypertension  No c/o chest pain, sob or headache. Does not check blood pressure at home. BP Readings from Last 3 Encounters:  08/03/17 (!) 144/76  05/01/17 137/89  10/24/16 129/76     2. Hyperlipidemia with target LDL less than 100  Does not watch diet .  3. Hypokalemia  No c/o  Lower ext cramping  4. Peripheral edema  Elevating legs helps with swelling  5. Class 3 severe obesity with serious comorbidity and body mass index (BMI) of 45.0 to 49.9 in adult, unspecified obesity type (HCC)  No recent weight changes  6. Idiopathic chronic gout of multiple sites without tophus  No recent flare ups. Does not have much indocin left, would like refill    New complaints: none  Social history: Patient is not married and lives by herself   Review of Systems  Constitutional: Negative for activity change and appetite change.  HENT: Negative.   Eyes: Negative for pain.  Respiratory:  Negative for shortness of breath.   Cardiovascular: Negative for chest pain, palpitations and leg swelling.  Gastrointestinal: Negative for abdominal pain.  Endocrine: Negative for polydipsia.  Genitourinary: Negative.   Skin: Negative for rash.  Neurological: Negative for dizziness, weakness and headaches.  Hematological: Does not bruise/bleed easily.  Psychiatric/Behavioral: Negative.   All other systems reviewed and are negative.      Objective:   Physical Exam  Constitutional: She is oriented to person, place, and time. She appears well-developed and well-nourished.  HENT:  Nose: Nose normal.  Mouth/Throat: Oropharynx is clear and moist.  Eyes: EOM are normal.  Neck: Trachea normal, normal range of motion and full passive range of motion without pain. Neck supple. No JVD present. Carotid bruit is not present. No thyromegaly present.  Cardiovascular: Normal rate, regular rhythm, normal heart sounds and intact distal pulses. Exam reveals no gallop and no friction rub.  No murmur heard. Pulmonary/Chest: Effort normal and breath sounds normal.  Abdominal: Soft. Bowel sounds are normal. She exhibits no distension and no mass. There is no tenderness.  Musculoskeletal: Normal range of motion.  Lymphadenopathy:    She has no cervical adenopathy.  Neurological: She is alert and oriented to person, place, and time. She has normal reflexes.  Skin: Skin is warm and dry.  Psychiatric: She has a normal mood and affect. Her  behavior is normal. Judgment and thought content normal.   BP 132/85   Pulse 73   Temp (!) 96.8 F (36 C) (Oral)   Ht _0  (1.549 m)   Wt 261 lb (118.4 kg)   LMP 11/03/2013   BMI 49.32 kg/m      Assessment & Plan:  1. Essential hypertension Low sodium diet - benazepril (LOTENSIN) 40 MG tablet; TAKE 1 BY MOUTH DAILY  Dispense: 90 tablet; Refill: 1 - CMP14+EGFR  2. Hyperlipidemia with target LDL less than 100 Low fat diet - fenofibrate 160 MG tablet; Take 1  tablet (160 mg total) by mouth daily.  Dispense: 90 tablet; Refill: 1 - atorvastatin (LIPITOR) 80 MG tablet; TAKE 1 BY MOUTH DAILY  Dispense: 90 tablet; Refill: 1 - Lipid panel  3. Hypokalemia - potassium chloride (KLOR-CON 10) 10 MEQ tablet; Take 1 tablet (10 mEq total) by mouth daily.  Dispense: 90 tablet; Refill: 1  4. Peripheral edema Elevate legs when sitting - furosemide (LASIX) 40 MG tablet; TAKE 1 BY MOUTH DAILY  Dispense: 90 tablet; Refill: 1  5. Class 3 severe obesity with serious comorbidity and body mass index (BMI) of 45.0 to 49.9 in adult, unspecified obesity type ([facility])  6. Idiopathic chronic gout of multiple sites without tophus Low purine diet reviewed - allopurinol (ZYLOPRIM) 300 MG tablet; TAKE ONE TABLET BY MOUTH ONCE DAILY  Dispense: 90 tablet; Refill: 1 - indomethacin (INDOCIN) 50 MG capsule; Take 1 capsule (50 mg total) by mouth 2 (two) times daily with a meal.  Dispense: 60 capsule; Refill: 2    Labs pending Health maintenance reviewed Diet and exercise encouraged Continue all meds Follow up  In 3 months   Saunders, FNP

## 2017-11-06 NOTE — Patient Instructions (Signed)

## 2017-11-07 LAB — CMP14+EGFR
ALK PHOS: 54 IU/L (ref 39–117)
ALT: 33 IU/L — AB (ref 0–32)
AST: 53 IU/L — AB (ref 0–40)
Albumin/Globulin Ratio: 1.5 (ref 1.2–2.2)
Albumin: 4.5 g/dL (ref 3.5–5.5)
BUN/Creatinine Ratio: 19 (ref 9–23)
BUN: 19 mg/dL (ref 6–24)
Bilirubin Total: 0.3 mg/dL (ref 0.0–1.2)
CALCIUM: 10.2 mg/dL (ref 8.7–10.2)
CO2: 24 mmol/L (ref 20–29)
CREATININE: 0.98 mg/dL (ref 0.57–1.00)
Chloride: 102 mmol/L (ref 96–106)
GFR calc Af Amer: 75 mL/min/{1.73_m2} (ref >59)
GFR, EST NON AFRICAN AMERICAN: 65 mL/min/{1.73_m2} (ref >59)
Globulin, Total: 3.1 g/dL (ref 1.5–4.5)
Glucose: 92 mg/dL (ref 65–99)
Potassium: 5 mmol/L (ref 3.5–5.2)
Sodium: 143 mmol/L (ref 134–144)
Total Protein: 7.6 g/dL (ref 6.0–8.5)

## 2017-11-07 LAB — LIPID PANEL
CHOLESTEROL TOTAL: 185 mg/dL (ref 100–199)
Chol/HDL Ratio: 4 ratio (ref 0.0–4.4)
HDL: 46 mg/dL (ref >39)
LDL CALC: 105 mg/dL — AB (ref 0–99)
TRIGLYCERIDES: 171 mg/dL — AB (ref 0–149)
VLDL CHOLESTEROL CAL: 34 mg/dL (ref 5–40)

## 2017-12-03 ENCOUNTER — Encounter: Payer: Self-pay | Admitting: Family

## 2017-12-03 ENCOUNTER — Ambulatory Visit (INDEPENDENT_AMBULATORY_CARE_PROVIDER_SITE_OTHER): Payer: BLUE CROSS/BLUE SHIELD | Admitting: Family

## 2017-12-03 VITALS — BP 125/79 | HR 82 | Temp 99.2°F | Ht 61.0 in | Wt 263.8 lb

## 2017-12-03 DIAGNOSIS — J209 Acute bronchitis, unspecified: Secondary | ICD-10-CM

## 2017-12-03 MED ORDER — HYDROCOD POLST-CPM POLST ER 10-8 MG/5ML PO SUER: 5.0000 mL | Freq: Two times a day (BID) | ORAL | 0 refills | Status: DC | PRN

## 2017-12-03 MED ORDER — PREDNISONE 10 MG (21) PO TBPK: ORAL_TABLET | ORAL | 0 refills | Status: DC

## 2017-12-03 NOTE — Progress Notes (Signed)
   Subjective:    Patient ID: Tammy Chavez, female    DOB: 1961-09-27, 57 y.o.   MRN: [id number]  Cough  This is a new problem. The current episode started 1 to 4 weeks ago. The problem has been gradually worsening. The problem occurs every few minutes. The cough is non-productive. Associated symptoms include chills, ear congestion, a fever, myalgias, nasal congestion, rhinorrhea and a sore throat. Pertinent negatives include no ear pain or headaches. The symptoms are aggravated by lying down. She has tried rest for the symptoms. The treatment provided mild relief. There is no history of asthma or COPD.      Review of Systems  Constitutional: Positive for chills and fever.  HENT: Positive for rhinorrhea and sore throat. Negative for ear pain.   Respiratory: Positive for cough.   Musculoskeletal: Positive for myalgias.  Neurological: Negative for headaches.  All other systems reviewed and are negative.      Objective:   Physical Exam  Constitutional: She is oriented to person, place, and time. She appears well-developed and well-nourished. No distress.  HENT:  Head: Normocephalic and atraumatic.  Right Ear: External ear normal.  Left Ear: External ear normal.  Nose: Mucosal edema and rhinorrhea present.  Mouth/Throat: Posterior oropharyngeal erythema present.  Eyes: Pupils are equal, round, and reactive to light.  Neck: Normal range of motion. Neck supple. No thyromegaly present.  Cardiovascular: Normal rate, regular rhythm, normal heart sounds and intact distal pulses.  No murmur heard. Pulmonary/Chest: Effort normal and breath sounds normal. No respiratory distress. She has no wheezes.  Intermittent nonproductive cough  Abdominal: Soft. Bowel sounds are normal. She exhibits no distension. There is no tenderness.  Musculoskeletal: Normal range of motion. She exhibits no edema or tenderness.  Neurological: She is alert and oriented to person, place, and time. She has normal  reflexes. No cranial nerve deficit.  Skin: Skin is warm and dry.  Psychiatric: She has a normal mood and affect. Her behavior is normal. Judgment and thought content normal.  Vitals reviewed.     BP 125/79   Pulse 82   Temp 99.2 F (37.3 C) (Oral)   Ht 5\' 1"  (1.549 m)   Wt 263 lb 12.8 oz (119.7 kg)   LMP 11/03/2013   BMI 49.84 kg/m      Assessment & Plan:  1. Acute bronchitis, unspecified organism - Take meds as prescribed - Use a cool mist humidifier  -Use saline nose sprays frequently -Force fluids -For any cough or congestion  Use plain Mucinex- regular strength or max strength is fine -For fever or aces or pains- take tylenol or ibuprofen appropriate for age and weight. -Throat lozenges if help -New toothbrush in 3 days - predniSONE (STERAPRED UNI-PAK 21 TAB) 10 MG (21) TBPK tablet; Use as directed  Dispense: 21 tablet; Refill: 0 - chlorpheniramine-HYDROcodone (TUSSIONEX) 10-8 MG/5ML SUER; Take 5 mLs by mouth every 12 (twelve) hours as needed for cough.  Dispense: 120 mL; Refill: 0    Jannifer Rodneyhristy Aryaan Persichetti, FNP

## 2017-12-03 NOTE — Patient Instructions (Signed)

## 2017-12-07 ENCOUNTER — Telehealth: Payer: Self-pay | Admitting: Nurse Practitioner

## 2017-12-07 NOTE — Telephone Encounter (Signed)
Aware. Will need to be seen if no improvement.

## 2017-12-07 NOTE — Telephone Encounter (Signed)
Patient needs to be seen if things are worsened after she finished the treatment course.  Arville CareJoshua Dettinger, MD [facility] 12/07/2017, 3:39 PM

## 2017-12-07 NOTE — Telephone Encounter (Signed)
Please advise if another script will be done.

## 2017-12-23 ENCOUNTER — Ambulatory Visit (INDEPENDENT_AMBULATORY_CARE_PROVIDER_SITE_OTHER): Payer: BLUE CROSS/BLUE SHIELD | Admitting: Nurse Practitioner

## 2017-12-23 ENCOUNTER — Encounter: Payer: Self-pay | Admitting: Nurse Practitioner

## 2017-12-23 VITALS — BP 120/60 | HR 63 | Temp 97.6°F | Ht 61.0 in | Wt 268.0 lb

## 2017-12-23 DIAGNOSIS — B373 Candidiasis of vulva and vagina: Secondary | ICD-10-CM

## 2017-12-23 DIAGNOSIS — L299 Pruritus, unspecified: Secondary | ICD-10-CM | POA: Diagnosis not present

## 2017-12-23 DIAGNOSIS — B3731 Acute candidiasis of vulva and vagina: Secondary | ICD-10-CM

## 2017-12-23 MED ORDER — METHYLPREDNISOLONE ACETATE 80 MG/ML IJ SUSP
80.0000 mg | Freq: Once | INTRAMUSCULAR | Status: AC
  Administered 2017-12-23: 80 mg via INTRAMUSCULAR

## 2017-12-23 MED ORDER — HYDROXYZINE HCL 25 MG PO TABS
25.0000 mg | ORAL_TABLET | Freq: Three times a day (TID) | ORAL | 0 refills | Status: DC | PRN
Start: 2017-12-23 — End: 2018-04-20

## 2017-12-23 MED ORDER — FLUCONAZOLE 150 MG PO TABS: 150.0000 mg | ORAL_TABLET | Freq: Once | ORAL | 0 refills | Status: AC

## 2017-12-23 NOTE — Patient Instructions (Signed)
Pruritus  Pruritus is an itching feeling. There are many different conditions and factors that can make your skin itchy. Dry skin is one of the most common causes of itching. Most cases of itching do not require medical attention. Itchy skin can turn into a rash.  Follow these instructions at home:  Watch your pruritus for any changes. Take these steps to help with your condition:  Skin Care  · Moisturize your skin as needed. A moisturizer that contains petroleum jelly is best for keeping moisture in your skin.  · Take or apply medicines only as directed by your health care provider. This may include:  ? Corticosteroid cream.  ? Anti-itch lotions.  ? Oral anti-histamines.  · Apply cool compresses to the affected areas.  · Try taking a bath with:  ? Epsom salts. Follow the instructions on the packaging. You can get these at your local pharmacy or grocery store.  ? Baking soda. Pour a small amount into the bath as directed by your health care provider.  ? Colloidal oatmeal. Follow the instructions on the packaging. You can get this at your local pharmacy or grocery store.  · Try applying baking soda paste to your skin. Stir water into baking soda until it reaches a paste-like consistency.  · Do not scratch your skin.  · Avoid hot showers or baths, which can make itching worse. A cold shower may help with itching as long as you use a moisturizer after.  · Avoid scented soaps, detergents, and perfumes. Use gentle soaps, detergents, perfumes, and other cosmetic products.  General instructions  · Avoid wearing tight clothes.  · Keep a journal to help track what causes your itch. Write down:  ? What you eat.  ? What cosmetic products you use.  ? What you drink.  ? What you wear. This includes jewelry.  · Use a humidifier. This keeps the air moist, which helps to prevent dry skin.  Contact a health care provider if:  · The itching does not go away after several days.  · You sweat at night.  · You have weight loss.  · You  are unusually thirsty.  · You urinate more than normal.  · You are more tired than normal.  · You have abdominal pain.  · Your skin tingles.  · You feel weak.  · Your skin or the whites of your eyes look yellow (jaundice).  · Your skin feels numb.  This information is not intended to replace advice given to you by your health care provider. Make sure you discuss any questions you have with your health care provider.  Document Released: 06/25/2011 Document Revised: 03/20/2016 Document Reviewed: 10/09/2014  Elsevier Interactive Patient Education © 2018 [employer]

## 2017-12-23 NOTE — Progress Notes (Signed)
   Subjective:    Patient ID: Tammy Chavez, female    DOB: 08-22-1961, 57 y.o.   MRN: [id number]  HPI Patient comes in today c/o itching from the waist up. Started Sunday on the way back from [place]. Started around lower abdomen and has spread upward. Denies rash.   * was on antbiotic 2 weeks ago now has vaginal discharge  Review of Systems  Constitutional: Negative.   Respiratory: Negative.   Cardiovascular: Negative.   Gastrointestinal: Negative.   Musculoskeletal: Negative.   Skin: Negative for rash.  Neurological: Negative.   All other systems reviewed and are negative.      Objective:   Physical Exam  Constitutional: She is oriented to person, place, and time. She appears well-developed and well-nourished. No distress.  Cardiovascular: Normal rate and regular rhythm.  Pulmonary/Chest: Effort normal and breath sounds normal.  Neurological: She is alert and oriented to person, place, and time.  Skin: Skin is warm. Rash noted.  3 erythematous lesion that appear to have been scratched and scabbed over - 1 on right forearm abd 2 on abdominal wall.  Psychiatric: She has a normal mood and affect. Her behavior is normal. Judgment and thought content normal.   BP 120/60   Pulse 63   Temp 97.6 F (36.4 C) (Oral)   Ht 5\' 1"  (1.549 m)   Wt 268 lb (121.6 kg)   LMP 11/03/2013   BMI 50.64 kg/m         Assessment & Plan:   1. Pruritus    Try not to scratch Sedation precaution with ativan Avoid real hot baths Follow up if not improving  2. Vaginal candidiasis - diflucan 150mg  1 po now  [name] Daphine DeutscherMartin, FNP

## 2018-02-03 ENCOUNTER — Ambulatory Visit (INDEPENDENT_AMBULATORY_CARE_PROVIDER_SITE_OTHER): Payer: BLUE CROSS/BLUE SHIELD | Admitting: Family Medicine

## 2018-02-03 VITALS — BP 112/71 | HR 68 | Temp 98.1°F | Ht 61.0 in | Wt 263.0 lb

## 2018-02-03 DIAGNOSIS — R55 Syncope and collapse: Secondary | ICD-10-CM

## 2018-02-03 DIAGNOSIS — L299 Pruritus, unspecified: Secondary | ICD-10-CM | POA: Diagnosis not present

## 2018-02-03 MED ORDER — LEVOCETIRIZINE DIHYDROCHLORIDE 5 MG PO TABS: 5.0000 mg | ORAL_TABLET | Freq: Every evening | ORAL | 0 refills | Status: DC

## 2018-02-03 NOTE — Progress Notes (Signed)
Subjective: CC: pruritis, syncope PCP: Bennie PieriniMartin, [name], FNP ZOX:[id number]:Tammy Chavez is a 57 y.o. female presenting to clinic today for:  1. Syncope Patient reports that she donated blood on Monday and had what sounds like a syncopal episode at home.  She reports that she had donated blood at the end of the donation shift and was not given time to sit and rest and hydrate after donation of blood.  She notes that she felt weird walking around afterwards but that she was able to make it to her car and drive home.  She notes that she had called her sister since she was feeling dizzy and had her stay on the phone with her while she drove until she got into her house.  She notes that her other sister was at the house and saw her fall backwards, hitting her head.  Patient notes that she has felt dizzy after donating blood in the past but has never passed out.  She denies associated heart palpitations, shortness of breath, nausea, vomiting, preceding illness.  She has a sore spot on the right forehead since the fall but otherwise has been asymptomatic.  She wanted to get checked out to make sure everything was okay.  2. Pruritis Patient notes generalized pruritus to the thorax and abdomen since February 2019.  She saw her primary care doctor in February who recommended that she use hydroxyzine.  She notes that hydroxyzine does seem to help some with itching but does not last long enough.  She denies rash, exudates, increased itching after bathing, new soaps, detergents, foods or lotions.  No new exposures that she knows of.   ROS: Per HPI  Allergies  Allergen Reactions  . Lisinopril     Cough   Past Medical History:  Diagnosis Date  . Gout   . Heel spur    Left  . Hyperlipidemia   . Hypertension     Current Outpatient Medications:  .  allopurinol (ZYLOPRIM) 300 MG tablet, TAKE ONE TABLET BY MOUTH ONCE DAILY, Disp: 90 tablet, Rfl: 1 .  atorvastatin (LIPITOR) 80 MG tablet, TAKE 1 BY MOUTH  DAILY, Disp: 90 tablet, Rfl: 1 .  benazepril (LOTENSIN) 40 MG tablet, TAKE 1 BY MOUTH DAILY, Disp: 90 tablet, Rfl: 1 .  fenofibrate 160 MG tablet, Take 1 tablet (160 mg total) by mouth daily., Disp: 90 tablet, Rfl: 1 .  furosemide (LASIX) 40 MG tablet, TAKE 1 BY MOUTH DAILY, Disp: 90 tablet, Rfl: 1 .  indomethacin (INDOCIN) 50 MG capsule, Take 1 capsule (50 mg total) by mouth 2 (two) times daily with a meal., Disp: 60 capsule, Rfl: 2 .  potassium chloride (KLOR-CON 10) 10 MEQ tablet, Take 1 tablet (10 mEq total) by mouth daily., Disp: 90 tablet, Rfl: 1 .  hydrOXYzine (ATARAX/VISTARIL) 25 MG tablet, Take 1 tablet (25 mg total) by mouth 3 (three) times daily as needed. (Patient not taking: Reported on 02/03/2018), Disp: 30 tablet, Rfl: 0 Social History   Socioeconomic History  . Marital status: Single    Spouse name: Not on file  . Number of children: Not on file  . Years of education: Not on file  . Highest education level: Not on file  Occupational History  . Not on file  Social Needs  . Financial resource strain: Not on file  . Food insecurity:    Worry: Not on file    Inability: Not on file  . Transportation needs:    Medical: Not on file  Non-medical: Not on file  Tobacco Use  . Smoking status: Never Smoker  . Smokeless tobacco: Never Used  Substance and Sexual Activity  . Alcohol use: No  . Drug use: No  . Sexual activity: Not on file  Lifestyle  . Physical activity:    Days per week: Not on file    Minutes per session: Not on file  . Stress: Not on file  Relationships  . Social connections:    Talks on phone: Not on file    Gets together: Not on file    Attends religious service: Not on file    Active member of club or organization: Not on file    Attends meetings of clubs or organizations: Not on file    Relationship status: Not on file  . Intimate partner violence:    Fear of current or ex partner: Not on file    Emotionally abused: Not on file    Physically  abused: Not on file    Forced sexual activity: Not on file  Other Topics Concern  . Not on file  Social History Narrative  . Not on file   Family History  Problem Relation Age of Onset  . Heart disease Mother   . Diabetes Sister   . Hyperlipidemia Brother   . Hyperlipidemia Brother   . Hyperlipidemia Brother   . Diabetes Sister     Objective: Office vital signs reviewed. BP 112/71   Pulse 68   Temp 98.1 F (36.7 C) (Oral)   Ht 5\' 1"  (1.549 m)   Wt 263 lb (119.3 kg)   LMP 11/03/2013   BMI 49.69 kg/m   Physical Examination:  General: Awake, alert, obese, No acute distress HEENT: Normocephalic, atraumatic.  No palpable cranial defects over area of concern.  No gross ecchymosis.    Neck: No masses palpated. No lymphadenopathy; no carotid bruits.    Ears: Tympanic membranes intact, normal light reflex, no erythema, no bulging; no hemotympanum    Eyes: PERRLA, extraocular membranes intact, sclera white    Nose: nasal turbinates moist, clear nasal discharge    Throat: moist mucus membranes, no erythema, no tonsillar exudate.  Airway is patent Cardio: regular rate and rhythm, S1S2 heard, no murmurs appreciated Pulm: clear to auscultation bilaterally, no wheezes, rhonchi or rales; normal work of breathing on room air Extremities: warm, well perfused, No edema, cyanosis or clubbing; +2 pulses bilaterally MSK: Normal gait and normal station Skin: dry; intact; no rashes or lesions Neuro: 5/5 UE and LE Strength and light touch sensation grossly intact, patellar DTRs 2/4; cranial nerves II through XII grossly intact.  Alert and oriented x3.  Normal upper extremity cerebellar testing.  No focal neurologic deficits.  Assessment/ Plan: 57 y.o. female   1. Vasovagal syncope Patient well-appearing with normal vital signs on today's exam.  I suspect that syncopal episode was vasovagal in nature given blood draw.  She has no focal neurologic deficits on exam.  Her cardiopulmonary exam was  totally unremarkable.  Doubt arrhythmia.  We discussed that if she recurrent symptoms that she should seek immediate medical attention.  At that point, low threshold for EKG, chest x-ray, blood labs and referral to cardiology for further evaluation.  Home care instructions reviewed.  Follow-up as needed.  2. Pruritus I question if this is allergy mediated versus secondary to dry skin.  No appreciable rash on today's exam.  Does not give history to suggest polycythemia vera.  Will place on 24-hour antihistamine.  Xyzal 5  mg prescribed.  Take nightly.  May use hydroxyzine for breakthrough itching.  Good moisturization encouraged.  Handout provided outlining this.  If persistent or does not respond to above therapies, consider referral to dermatology or allergy for further evaluation.   Meds ordered this encounter  Medications  . levocetirizine (XYZAL) 5 MG tablet    Sig: Take 1 tablet (5 mg total) by mouth every evening.    Dispense:  30 tablet    Refill:  0     Anastazja Isaac Hulen Skains, DO [facility] [phone]

## 2018-02-03 NOTE — Patient Instructions (Addendum)
Vasovagal Syncope, Adult Syncope, which is commonly known as fainting or passing out, is a temporary loss of consciousness. It occurs when the blood flow to the brain is reduced. Vasovagal syncope, also called neurocardiogenic syncope, is a fainting spell that happens when blood flow to the brain is reduced because of a sudden drop in heart rate and blood pressure. Vasovagal syncope is usually harmless. However, you can get injured if you fall during a fainting spell. What are the causes? This condition is caused by a drop in heart rate and blood pressure, usually in response to a trigger. Many things and situations can trigger an episode, including:  Pain.  Fear.  The sight of blood. This may occur during medical procedures, such as when blood is being drawn from a vein.  Common activities, such as coughing, swallowing, stretching, or going to the bathroom.  Emotional stress.  Being in a confined space.  Prolonged standing, especially in a warm environment.  Lack of sleep or rest.  Not eating for a long time.  Not drinking enough liquids.  Recent illness.  Drinking alcohol.  Taking drugs that affect blood pressure, such as marijuana, cocaine, opiates, or inhalants.  What are the signs or symptoms? Before a fainting episode, you may:  Feel dizzy or light-headed.  Become pale.  Sense that you are going to faint.  Feel like the room is spinning.  Only see directly ahead (tunnel vision).  Feel sick to your stomach (nauseous).  See spots.  Slowly lose vision.  Hear ringing in your ears.  Have a headache.  Feel warm and sweaty.  Feel a sensation of pins and needles.  During the fainting spell, you may twitch or make jerky movements. Fainting spells usually last no longer than a few minutes before you wake up. If you get up too quickly before your body can recover, you may faint again. How is this diagnosed? This condition is diagnosed based on your symptoms,  your medical history, and a physical exam. Tests may be done to rule out other causes of fainting. Tests may include:  Blood tests.  Heart tests, such as an electrocardiogram (ECG), echocardiogram, or electrophysiology study.  A test to check your response to changes in position (tilt table test).  How is this treated? Usually, treatment is not needed for this condition. Your health care provider may suggest ways to help prevent fainting episodes. These may include:  Drinking additional fluids if you are exposed to a trigger.  Sitting or lying down if you notice signs that an episode is coming.  If your fainting spells continue, your health care provider may recommend that you:  Take medicines to prevent fainting or to help reduce further episodes of fainting.  Do certain exercises.  Wear compression stockings.  Have surgery to place a pacemaker in your body (rare).  Follow these instructions at home:  Learn to identify the signs that an episode is coming.  Sit or lie down at the first sign of a fainting spell. If you sit down, put your head down between your legs. If you lie down, swing your legs up in the air to increase blood flow to the brain.  Avoid hot tubs and saunas.  Avoid standing for a long time. If you have to stand for a long time, try: ? Crossing your legs. ? Flexing and stretching your leg muscles. ? Squatting. ? Moving your legs. ? Bending over.  Drink enough fluid to keep your urine clear or pale  yellow.  Make changes to your diet that your health care provider recommends. You may be told to: ? Avoid caffeine. ? Eat more salt.  Take over-the-counter and prescription medicines only as told by your health care provider. Contact a health care provider if:  You continue to have fainting spells despite treatment.  You faint more often despite treatment.  You lose consciousness for more than a few minutes.  You faint during or after exercising or  after being startled.  You have twitching or jerky movements for longer than a few seconds during a fainting spell.  You have an episode of twitching or jerky movements without fainting. Get help right away if:  A fainting spell leads to an injury or bleeding.  You have new symptoms that occur with the fainting spells, such as: ? Shortness of breath. ? Chest pain. ? Irregular heartbeat.  You twitch or make jerky movements for more than 5 minutes.  You twitch or make jerky movements during more than one fainting spell. This information is not intended to replace advice given to you by your health care provider. Make sure you discuss any questions you have with your health care provider. Document Released: 09/29/2012 Document Revised: 03/26/2016 Document Reviewed: 08/11/2015 Elsevier Interactive Patient Education  2018 [employer]  Basic Skin Care Your skin plays an important role in keeping the entire body healthy.  Below are some tips on how to try and maximize skin health from the outside in.  1) Bathe in mildly warm water every 1 to 3 days, followed by light drying and an application of a thick moisturizer cream or ointment, preferably one that comes in a tub. a. Fragrance free moisturizing bars or body washes are preferred such as Purpose, Cetaphil, Dove sensitive skin, Aveeno, [employer] Baby or Vanicream products. b. Use a fragrance free cream or ointment, not a lotion, such as plain petroleum jelly or Vaseline ointment, Aquaphor, Vanicream, Eucerin cream or a generic version, CeraVe Cream, Cetaphil Restoraderm, Aveeno Eczema Therapy and [employer] Baby Calming, among others. c. Children with very dry skin often need to put on these creams two, three or four times a day.  As much as possible, use these creams enough to keep the skin from looking dry. d. Consider using fragrance free/dye free detergent, such as Arm and Hammer for sensitive skin, Tide Free or All Free.   2) If I  am prescribing a medication to go on the skin, the medicine goes on first to the areas that need it, followed by a thick cream as above to the entire body.  3) Wynelle LinkSun is a major cause of damage to the skin. a. I recommend sun protection for all of my patients. I prefer physical barriers such as hats with wide brims that cover the ears, long sleeve clothing with SPF protection including rash guards for swimming. These can be found seasonally at outdoor clothing companies, [employer] and [employer] and online at [employer]ornewww.coolibar.com, www.uvskinz.com and BrideEmporium.nlwww.sunprecautions.com. Avoid peak sun between the hours of 10am to 3pm to minimize sun exposure.  b. I recommend sunscreen for all of my patients older than 86 months of age when in the sun, preferably with broad spectrum coverage and SPF 30 or higher.  i. For children, I recommend sunscreens that only contain titanium dioxide and/or zinc oxide in the active ingredients. These do not burn the eyes and appear to be safer than chemical sunscreens. These sunscreens include zinc oxide paste found in the diaper section, Vanicream Broad Spectrum  50+, Aveeno Natural Mineral Protection, Neutrogena Pure and Free [address], [place] and [employer] Daily face and body lotion, [employer], among others. ii. There is no such thing as waterproof sunscreen. All sunscreens should be reapplied after 60-80 minutes of wear.  iii. Spray on sunscreens often use chemical sunscreens which do protect against the sun. However, these can be difficult to apply correctly, especially if wind is present, and can be more likely to irritate the skin.  Long term effects of chemical sunscreens are also not fully known.

## 2018-02-04 ENCOUNTER — Telehealth: Payer: Self-pay

## 2018-02-04 NOTE — Telephone Encounter (Signed)
Please let patient know that insurance will not pay for xyzal. Has she tried generic zyrtec?

## 2018-02-04 NOTE — Telephone Encounter (Signed)
Insurance denied prior auth for Levocetirizine  Drugs that have OTC alternatives are not covered

## 2018-02-08 NOTE — Telephone Encounter (Signed)
lmtcb

## 2018-02-16 ENCOUNTER — Encounter: Payer: Self-pay | Admitting: Nurse Practitioner

## 2018-02-16 ENCOUNTER — Ambulatory Visit (INDEPENDENT_AMBULATORY_CARE_PROVIDER_SITE_OTHER): Payer: BLUE CROSS/BLUE SHIELD | Admitting: Nurse Practitioner

## 2018-02-16 VITALS — BP 119/80 | HR 72 | Temp 97.6°F | Ht 61.0 in | Wt 263.0 lb

## 2018-02-16 DIAGNOSIS — E782 Mixed hyperlipidemia: Secondary | ICD-10-CM

## 2018-02-16 DIAGNOSIS — J301 Allergic rhinitis due to pollen: Secondary | ICD-10-CM

## 2018-02-16 DIAGNOSIS — R609 Edema, unspecified: Secondary | ICD-10-CM

## 2018-02-16 DIAGNOSIS — Z6841 Body Mass Index (BMI) 40.0 and over, adult: Secondary | ICD-10-CM | POA: Diagnosis not present

## 2018-02-16 DIAGNOSIS — D508 Other iron deficiency anemias: Secondary | ICD-10-CM

## 2018-02-16 DIAGNOSIS — I10 Essential (primary) hypertension: Secondary | ICD-10-CM

## 2018-02-16 DIAGNOSIS — E876 Hypokalemia: Secondary | ICD-10-CM | POA: Diagnosis not present

## 2018-02-16 DIAGNOSIS — M1A09X0 Idiopathic chronic gout, multiple sites, without tophus (tophi): Secondary | ICD-10-CM

## 2018-02-16 MED ORDER — BENAZEPRIL HCL 40 MG PO TABS
ORAL_TABLET | ORAL | 1 refills | Status: DC
Start: 2018-02-16 — End: 2023-05-26

## 2018-02-16 MED ORDER — FUROSEMIDE 40 MG PO TABS: ORAL_TABLET | ORAL | 1 refills | Status: DC

## 2018-02-16 MED ORDER — LEVOCETIRIZINE DIHYDROCHLORIDE 5 MG PO TABS: 5.0000 mg | ORAL_TABLET | Freq: Every evening | ORAL | 0 refills | Status: DC

## 2018-02-16 MED ORDER — POTASSIUM CHLORIDE ER 10 MEQ PO TBCR: 10.0000 meq | EXTENDED_RELEASE_TABLET | Freq: Every day | ORAL | 1 refills | Status: DC

## 2018-02-16 MED ORDER — FENOFIBRATE 160 MG PO TABS
160.0000 mg | ORAL_TABLET | Freq: Every day | ORAL | 1 refills | Status: DC
Start: 2018-02-16 — End: 2023-06-11

## 2018-02-16 MED ORDER — INDOMETHACIN 50 MG PO CAPS
50.0000 mg | ORAL_CAPSULE | Freq: Two times a day (BID) | ORAL | 2 refills | Status: DC
Start: 2018-02-16 — End: 2023-06-30

## 2018-02-16 MED ORDER — ATORVASTATIN CALCIUM 80 MG PO TABS: ORAL_TABLET | ORAL | 1 refills | Status: DC

## 2018-02-16 NOTE — Patient Instructions (Signed)
DASH Eating Plan DASH stands for "Dietary Approaches to Stop Hypertension." The DASH eating plan is a healthy eating plan that has been shown to reduce high blood pressure (hypertension). It may also reduce your risk for type 2 diabetes, heart disease, and stroke. The DASH eating plan may also help with weight loss. What are tips for following this plan? General guidelines  Avoid eating more than 2,300 mg (milligrams) of salt (sodium) a day. If you have hypertension, you may need to reduce your sodium intake to 1,500 mg a day.  Limit alcohol intake to no more than 1 drink a day for nonpregnant women and 2 drinks a day for men. One drink equals 12 oz of beer, 5 oz of wine, or 1 oz of hard liquor.  Work with your health care provider to maintain a healthy body weight or to lose weight. Ask what an ideal weight is for you.  Get at least 30 minutes of exercise that causes your heart to beat faster (aerobic exercise) most days of the week. Activities may include walking, swimming, or biking.  Work with your health care provider or diet and nutrition specialist (dietitian) to adjust your eating plan to your individual calorie needs. Reading food labels  Check food labels for the amount of sodium per serving. Choose foods with less than 5 percent of the Daily Value of sodium. Generally, foods with less than 300 mg of sodium per serving fit into this eating plan.  To find whole grains, look for the word "whole" as the first word in the ingredient list. Shopping  Buy products labeled as "low-sodium" or "no salt added."  Buy fresh foods. Avoid canned foods and premade or frozen meals. Cooking  Avoid adding salt when cooking. Use salt-free seasonings or herbs instead of table salt or sea salt. Check with your health care provider or pharmacist before using salt substitutes.  Do not fry foods. Cook foods using healthy methods such as baking, boiling, grilling, and broiling instead.  Cook with  heart-healthy oils, such as olive, canola, soybean, or sunflower oil. Meal planning   Eat a balanced diet that includes: ? 5 or more servings of fruits and vegetables each day. At each meal, try to fill half of your plate with fruits and vegetables. ? Up to 6-8 servings of whole grains each day. ? Less than 6 oz of lean meat, poultry, or fish each day. A 3-oz serving of meat is about the same size as a deck of cards. One egg equals 1 oz. ? 2 servings of low-fat dairy each day. ? A serving of nuts, seeds, or beans 5 times each week. ? Heart-healthy fats. Healthy fats called Omega-3 fatty acids are found in foods such as flaxseeds and coldwater fish, like sardines, salmon, and mackerel.  Limit how much you eat of the following: ? Canned or prepackaged foods. ? Food that is high in trans fat, such as fried foods. ? Food that is high in saturated fat, such as fatty meat. ? Sweets, desserts, sugary drinks, and other foods with added sugar. ? Full-fat dairy products.  Do not salt foods before eating.  Try to eat at least 2 vegetarian meals each week.  Eat more home-cooked food and less restaurant, buffet, and fast food.  When eating at a restaurant, ask that your food be prepared with less salt or no salt, if possible. What foods are recommended? The items listed may not be a complete list. Talk with your dietitian about what   dietary choices are best for you. Grains Whole-grain or whole-wheat bread. Whole-grain or whole-wheat pasta. Brown rice. Oatmeal. Quinoa. Bulgur. Whole-grain and low-sodium cereals. Pita bread. Low-fat, low-sodium crackers. Whole-wheat flour tortillas. Vegetables Fresh or frozen vegetables (raw, steamed, roasted, or grilled). Low-sodium or reduced-sodium tomato and vegetable juice. Low-sodium or reduced-sodium tomato sauce and tomato paste. Low-sodium or reduced-sodium canned vegetables. Fruits All fresh, dried, or frozen fruit. Canned fruit in natural juice (without  added sugar). Meat and other protein foods Skinless chicken or turkey. Ground chicken or turkey. Pork with fat trimmed off. Fish and seafood. Egg whites. Dried beans, peas, or lentils. Unsalted nuts, nut butters, and seeds. Unsalted canned beans. Lean cuts of beef with fat trimmed off. Low-sodium, lean deli meat. Dairy Low-fat (1%) or fat-free (skim) milk. Fat-free, low-fat, or reduced-fat cheeses. Nonfat, low-sodium ricotta or cottage cheese. Low-fat or nonfat yogurt. Low-fat, low-sodium cheese. Fats and oils Soft margarine without trans fats. Vegetable oil. Low-fat, reduced-fat, or light mayonnaise and salad dressings (reduced-sodium). Canola, safflower, olive, soybean, and sunflower oils. Avocado. Seasoning and other foods Herbs. Spices. Seasoning mixes without salt. Unsalted popcorn and pretzels. Fat-free sweets. What foods are not recommended? The items listed may not be a complete list. Talk with your dietitian about what dietary choices are best for you. Grains Baked goods made with fat, such as croissants, muffins, or some breads. Dry pasta or rice meal packs. Vegetables Creamed or fried vegetables. Vegetables in a cheese sauce. Regular canned vegetables (not low-sodium or reduced-sodium). Regular canned tomato sauce and paste (not low-sodium or reduced-sodium). Regular tomato and vegetable juice (not low-sodium or reduced-sodium). Pickles. Olives. Fruits Canned fruit in a light or heavy syrup. Fried fruit. Fruit in cream or butter sauce. Meat and other protein foods Fatty cuts of meat. Ribs. Fried meat. Bacon. Sausage. Bologna and other processed lunch meats. Salami. Fatback. Hotdogs. Bratwurst. Salted nuts and seeds. Canned beans with added salt. Canned or smoked fish. Whole eggs or egg yolks. Chicken or turkey with skin. Dairy Whole or 2% milk, cream, and half-and-half. Whole or full-fat cream cheese. Whole-fat or sweetened yogurt. Full-fat cheese. Nondairy creamers. Whipped toppings.  Processed cheese and cheese spreads. Fats and oils Butter. Stick margarine. Lard. Shortening. Ghee. Bacon fat. Tropical oils, such as coconut, palm kernel, or palm oil. Seasoning and other foods Salted popcorn and pretzels. Onion salt, garlic salt, seasoned salt, table salt, and sea salt. Worcestershire sauce. Tartar sauce. Barbecue sauce. Teriyaki sauce. Soy sauce, including reduced-sodium. Steak sauce. Canned and packaged gravies. Fish sauce. Oyster sauce. Cocktail sauce. Horseradish that you find on the shelf. Ketchup. Mustard. Meat flavorings and tenderizers. Bouillon cubes. Hot sauce and Tabasco sauce. Premade or packaged marinades. Premade or packaged taco seasonings. Relishes. Regular salad dressings. Where to find more information:  National Heart, Lung, and Blood Institute: www.nhlbi.nih.gov  American Heart Association: www.heart.org Summary  The DASH eating plan is a healthy eating plan that has been shown to reduce high blood pressure (hypertension). It may also reduce your risk for type 2 diabetes, heart disease, and stroke.  With the DASH eating plan, you should limit salt (sodium) intake to 2,300 mg a day. If you have hypertension, you may need to reduce your sodium intake to 1,500 mg a day.  When on the DASH eating plan, aim to eat more fresh fruits and vegetables, whole grains, lean proteins, low-fat dairy, and heart-healthy fats.  Work with your health care provider or diet and nutrition specialist (dietitian) to adjust your eating plan to your individual   calorie needs. This information is not intended to replace advice given to you by your health care provider. Make sure you discuss any questions you have with your health care provider. Document Released: 10/02/2011 Document Revised: 10/06/2016 Document Reviewed: 10/06/2016 Elsevier Interactive Patient Education  2018 Elsevier Inc.  

## 2018-02-16 NOTE — Progress Notes (Signed)
Subjective:    Patient ID: Tammy Chavez, female    DOB: 1961/10/19, 57 y.o.   MRN: [id number]  HPI  Tammy Chavez is here today for follow up of chronic medical problem.  Outpatient Encounter Medications as of 02/16/2018  Medication Sig  . allopurinol (ZYLOPRIM) 300 MG tablet TAKE ONE TABLET BY MOUTH ONCE DAILY  . atorvastatin (LIPITOR) 80 MG tablet TAKE 1 BY MOUTH DAILY  . benazepril (LOTENSIN) 40 MG tablet TAKE 1 BY MOUTH DAILY  . fenofibrate 160 MG tablet Take 1 tablet (160 mg total) by mouth daily.  . furosemide (LASIX) 40 MG tablet TAKE 1 BY MOUTH DAILY  . hydrOXYzine (ATARAX/VISTARIL) 25 MG tablet Take 1 tablet (25 mg total) by mouth 3 (three) times daily as needed.  . indomethacin (INDOCIN) 50 MG capsule Take 1 capsule (50 mg total) by mouth 2 (two) times daily with a meal.  . potassium chloride (KLOR-CON 10) 10 MEQ tablet Take 1 tablet (10 mEq total) by mouth daily.  Marland Kitchen levocetirizine (XYZAL) 5 MG tablet Take 1 tablet (5 mg total) by mouth every evening. (Patient not taking: Reported on 02/16/2018)     1. Benign hypertension  No c/o chest pain, sob or headache. Doe snot check blood pressure at home. BP Readings from Last 3 Encounters:  02/16/18 119/80  02/03/18 112/71  12/23/17 120/60     2. Peripheral edema  Has daily but resolves at night  3. Class 3 severe obesity with serious comorbidity and body mass index (BMI) of 45.0 to 49.9  in adult, unspecified obesity type (HCC)  No recent change in weight  4. Hypokalemia   no c/o leg cramps  5. Mixed hyperlipidemia  Does not watch diet at all and does not exercise  6. Idiopathic chronic gout of multiple sites without tophus  No recent flare ups  7. Iron deficiency anemia secondary to inadequate dietary iron intake  Need to check labs today    New complaints: Has been exposed o black mold in her house.  Feels ok, just wanted [place] to be aware if it. Allergies are bothering her some- hopes it is not due to  mold Social history: Lives alone- has a brother that lives close by.    Review of Systems  Constitutional: Negative for activity change and appetite change.  HENT: Negative.   Eyes: Negative for pain.  Respiratory: Negative for shortness of breath.   Cardiovascular: Negative for chest pain, palpitations and leg swelling.  Gastrointestinal: Negative for abdominal pain.  Endocrine: Negative for polydipsia.  Genitourinary: Negative.   Skin: Negative for rash.  Neurological: Negative for dizziness, weakness and headaches.  Hematological: Does not bruise/bleed easily.  Psychiatric/Behavioral: Negative.   All other systems reviewed and are negative.      Objective:   Physical Exam  Constitutional: She is oriented to person, place, and time. She appears well-developed and well-nourished.  HENT:  Nose: Nose normal.  Mouth/Throat: Oropharynx is clear and moist.  Eyes: EOM are normal.  Neck: Trachea normal, normal range of motion and full passive range of motion without pain. Neck supple. No JVD present. Carotid bruit is not present. No thyromegaly present.  Cardiovascular: Normal rate, regular rhythm, normal heart sounds and intact distal pulses. Exam reveals no gallop and no friction rub.  No murmur heard. Pulmonary/Chest: Effort normal and breath sounds normal.  Abdominal: Soft. Bowel sounds are normal. She exhibits no distension and no mass. There is no tenderness.  Musculoskeletal: Normal range of motion.  Lymphadenopathy:    She has no cervical adenopathy.  Neurological: She is alert and oriented to person, place, and time. She has normal reflexes.  Skin: Skin is warm and dry.  Psychiatric: She has a normal mood and affect. Her behavior is normal. Judgment and thought content normal.   BP 119/80   Pulse 72   Temp 97.6 F (36.4 C) (Oral)   Ht _0  (1.549 m)   Wt 263 lb (119.3 kg)   LMP 11/03/2013   BMI 49.69 kg/m         Assessment & Plan:  1. Benign  hypertension Low sodium diet discussed - CMP14+EGFR - benazepril (LOTENSIN) 40 MG tablet; TAKE 1 BY MOUTH DAILY  Dispense: 90 tablet; Refill: 1  2. Peripheral edema Elevate legs when sitting - furosemide (LASIX) 40 MG tablet; TAKE 1 BY MOUTH DAILY  Dispense: 90 tablet; Refill: 1  3. Class 3 severe obesity with serious comorbidity and body mass index (BMI) of 45.0 to 49.9 in adult, unspecified obesity type ([place]) Discussed diet and exercise for person with BMI >25 Will recheck weight in 3-6 months  4. Hypokalemia - potassium chloride (KLOR-CON 10) 10 MEQ tablet; Take 1 tablet (10 mEq total) by mouth daily.  Dispense: 90 tablet; Refill: 1  5. Mixed hyperlipidemia Low fat diet - Lipid panel- fenofibrate 160 MG tablet; Take 1 tablet (160 mg total) by mouth daily.  Dispense: 90 tablet; Refill: 1 - atorvastatin (LIPITOR) 80 MG tablet; TAKE 1 BY MOUTH DAILY  Dispense: 90 tablet; Refill: 1  6. Idiopathic chronic gout of multiple sites without tophus Low pruine diet  7. Iron deficiency anemia secondary to inadequate dietary iron intake Labs pending - Anemia Profile B  8. Seasonal allergic rhinitis due to pollen Get mold taken care of as soon as possible - levocetirizine (XYZAL) 5 MG tablet; Take 1 tablet (5 mg total) by mouth every evening.  Dispense: 30 tablet; Refill: 0    Labs pending Health maintenance reviewed Diet and exercise encouraged Continue all meds Follow up  In 6 months   [place], FNP

## 2018-02-16 NOTE — Addendum Note (Signed)
Addended by: Bennie PieriniMARTIN, [name] on: 02/16/2018 08:47 AM   Modules accepted: Orders

## 2018-02-17 LAB — CMP14+EGFR
ALT: 30 IU/L (ref 0–32)
AST: 34 IU/L (ref 0–40)
Albumin/Globulin Ratio: 1.5 (ref 1.2–2.2)
Albumin: 4.6 g/dL (ref 3.5–5.5)
Alkaline Phosphatase: 62 IU/L (ref 39–117)
BUN/Creatinine Ratio: 26 — ABNORMAL HIGH (ref 9–23)
BUN: 28 mg/dL — AB (ref 6–24)
Bilirubin Total: 0.3 mg/dL (ref 0.0–1.2)
CALCIUM: 10.4 mg/dL — AB (ref 8.7–10.2)
CO2: 27 mmol/L (ref 20–29)
Chloride: 100 mmol/L (ref 96–106)
Creatinine, Ser: 1.09 mg/dL — ABNORMAL HIGH (ref 0.57–1.00)
GFR, EST AFRICAN AMERICAN: 66 mL/min/{1.73_m2} (ref >59)
GFR, EST NON AFRICAN AMERICAN: 57 mL/min/{1.73_m2} — AB (ref >59)
GLUCOSE: 93 mg/dL (ref 65–99)
Globulin, Total: 3.1 g/dL (ref 1.5–4.5)
Potassium: 5.6 mmol/L — ABNORMAL HIGH (ref 3.5–5.2)
Sodium: 143 mmol/L (ref 134–144)
TOTAL PROTEIN: 7.7 g/dL (ref 6.0–8.5)

## 2018-02-17 LAB — ANEMIA PROFILE B
BASOS ABS: 0 10*3/uL (ref 0.0–0.2)
Basos: 0 %
EOS (ABSOLUTE): 0.3 10*3/uL (ref 0.0–0.4)
Eos: 3 %
FOLATE: 6 ng/mL (ref >3.0)
Ferritin: 139 ng/mL (ref 15–150)
Hematocrit: 40.9 % (ref 34.0–46.6)
Hemoglobin: 13.2 g/dL (ref 11.1–15.9)
IMMATURE GRANULOCYTES: 0 %
IRON: 56 ug/dL (ref 27–159)
Immature Grans (Abs): 0 10*3/uL (ref 0.0–0.1)
Iron Saturation: 11 % — ABNORMAL LOW (ref 15–55)
LYMPHS ABS: 3 10*3/uL (ref 0.7–3.1)
Lymphs: 38 %
MCH: 28.8 pg (ref 26.6–33.0)
MCHC: 32.3 g/dL (ref 31.5–35.7)
MCV: 89 fL (ref 79–97)
MONOCYTES: 4 %
Monocytes Absolute: 0.3 10*3/uL (ref 0.1–0.9)
NEUTROS PCT: 55 %
Neutrophils Absolute: 4.3 10*3/uL (ref 1.4–7.0)
PLATELETS: 430 10*3/uL — AB (ref 150–379)
RBC: 4.59 x10E6/uL (ref 3.77–5.28)
RDW: 15.2 % (ref 12.3–15.4)
Retic Ct Pct: 2 % (ref 0.6–2.6)
TIBC: 526 ug/dL — AB (ref 250–450)
UIBC: 470 ug/dL — AB (ref 131–425)
Vitamin B-12: 586 pg/mL (ref 232–1245)
WBC: 7.9 10*3/uL (ref 3.4–10.8)

## 2018-02-17 LAB — LIPID PANEL
CHOL/HDL RATIO: 3.9 ratio (ref 0.0–4.4)
Cholesterol, Total: 180 mg/dL (ref 100–199)
HDL: 46 mg/dL (ref >39)
LDL Calculated: 96 mg/dL (ref 0–99)
TRIGLYCERIDES: 192 mg/dL — AB (ref 0–149)
VLDL CHOLESTEROL CAL: 38 mg/dL (ref 5–40)
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
## 2018-04-20 ENCOUNTER — Encounter: Payer: Self-pay | Admitting: Nurse Practitioner

## 2018-04-20 ENCOUNTER — Ambulatory Visit (INDEPENDENT_AMBULATORY_CARE_PROVIDER_SITE_OTHER): Payer: BLUE CROSS/BLUE SHIELD | Admitting: Nurse Practitioner

## 2018-04-20 VITALS — BP 157/88 | HR 76 | Temp 98.1°F | Ht 61.0 in | Wt 275.0 lb

## 2018-04-20 DIAGNOSIS — M7661 Achilles tendinitis, right leg: Secondary | ICD-10-CM | POA: Diagnosis not present

## 2018-04-20 DIAGNOSIS — L299 Pruritus, unspecified: Secondary | ICD-10-CM

## 2018-04-20 MED ORDER — MELOXICAM 15 MG PO TABS: 15.0000 mg | ORAL_TABLET | Freq: Every day | ORAL | 0 refills | Status: DC

## 2018-04-20 MED ORDER — HYDROXYZINE HCL 25 MG PO TABS: 25.0000 mg | ORAL_TABLET | Freq: Three times a day (TID) | ORAL | 0 refills | Status: DC | PRN

## 2018-04-20 NOTE — Patient Instructions (Signed)
Pruritus  Pruritus is an itching feeling. There are many different conditions and factors that can make your skin itchy. Dry skin is one of the most common causes of itching. Most cases of itching do not require medical attention. Itchy skin can turn into a rash.  Follow these instructions at home:  Watch your pruritus for any changes. Take these steps to help with your condition:  Skin Care  · Moisturize your skin as needed. A moisturizer that contains petroleum jelly is best for keeping moisture in your skin.  · Take or apply medicines only as directed by your health care provider. This may include:  ? Corticosteroid cream.  ? Anti-itch lotions.  ? Oral anti-histamines.  · Apply cool compresses to the affected areas.  · Try taking a bath with:  ? Epsom salts. Follow the instructions on the packaging. You can get these at your local pharmacy or grocery store.  ? Baking soda. Pour a small amount into the bath as directed by your health care provider.  ? Colloidal oatmeal. Follow the instructions on the packaging. You can get this at your local pharmacy or grocery store.  · Try applying baking soda paste to your skin. Stir water into baking soda until it reaches a paste-like consistency.  · Do not scratch your skin.  · Avoid hot showers or baths, which can make itching worse. A cold shower may help with itching as long as you use a moisturizer after.  · Avoid scented soaps, detergents, and perfumes. Use gentle soaps, detergents, perfumes, and other cosmetic products.  General instructions  · Avoid wearing tight clothes.  · Keep a journal to help track what causes your itch. Write down:  ? What you eat.  ? What cosmetic products you use.  ? What you drink.  ? What you wear. This includes jewelry.  · Use a humidifier. This keeps the air moist, which helps to prevent dry skin.  Contact a health care provider if:  · The itching does not go away after several days.  · You sweat at night.  · You have weight loss.  · You  are unusually thirsty.  · You urinate more than normal.  · You are more tired than normal.  · You have abdominal pain.  · Your skin tingles.  · You feel weak.  · Your skin or the whites of your eyes look yellow (jaundice).  · Your skin feels numb.  This information is not intended to replace advice given to you by your health care provider. Make sure you discuss any questions you have with your health care provider.  Document Released: 06/25/2011 Document Revised: 03/20/2016 Document Reviewed: 10/09/2014  Elsevier Interactive Patient Education © 2018 [employer]

## 2018-04-20 NOTE — Progress Notes (Signed)
   Subjective:    Patient ID: Tammy Chavez, female    DOB: 12/02/1960, 57 y.o.   MRN: [id number]   Chief Complaint: Still itching (Wants to know if she can be tested for mold); Cough; and Right ankle pain   HPI - patient is c/o still itching. She says that she itches all the time. She thinks it is from mold exposure. Would like to be tested. She takes a xyzal daily which is not helping. - she has had a cough every since she has started itching. Now she has and itching feeling in her throat since coughing. - right achilles pain. Feels tight and sometimes feels like it her ankle is going to give away. This started about 2 1/2 weeks ago. She denies injury.    Review of Systems  Constitutional: Negative.   HENT: Negative.   Respiratory: Negative.   Cardiovascular: Negative.   Gastrointestinal: Negative.   Neurological: Negative.   Psychiatric/Behavioral: Negative.   All other systems reviewed and are negative.      Objective:   Physical Exam  Constitutional: She is oriented to person, place, and time. She appears well-developed and well-nourished. No distress.  HENT:  Head: Normocephalic.  Nose: Nose normal.  Mouth/Throat: Oropharynx is clear and moist.  Eyes: Pupils are equal, round, and reactive to light. EOM are normal.  Neck: Normal range of motion. Neck supple. No JVD present. Carotid bruit is not present.  Cardiovascular: Normal rate, regular rhythm, normal heart sounds and intact distal pulses.  Pulmonary/Chest: Effort normal and breath sounds normal. No respiratory distress. She has no wheezes. She has no rales. She exhibits no tenderness.  Abdominal: Soft. Normal appearance, normal aorta and bowel sounds are normal. She exhibits no distension, no abdominal bruit, no pulsatile midline mass and no mass. There is no splenomegaly or hepatomegaly. There is no tenderness.  Musculoskeletal: Normal range of motion. She exhibits no edema.  Lymphadenopathy:    She has no  cervical adenopathy.  Neurological: She is alert and oriented to person, place, and time. She has normal reflexes.  Skin: Skin is warm and dry. No rash noted.  Psychiatric: She has a normal mood and affect. Her behavior is normal. Judgment and thought content normal.   BP (!) 157/88   Pulse 76   Temp 98.1 F (36.7 C) (Oral)   Ht 5\' 1"  (1.549 m)   Wt 275 lb (124.7 kg)   LMP 11/03/2013   BMI 51.96 kg/m         Assessment & Plan:  Tammy Chavez in today with chief complaint of Still itching (Wants to know if she can be tested for mold); Cough; and Right ankle pain   1. Pruritus Wait on allergy testing - Allergen Profile, Mold  2. Achilles tendinitis of right lower extremity stretching exercises discussed If not iproving- will ortho referral - meloxicam (MOBIC) 15 MG tablet; Take 1 tablet (15 mg total) by mouth daily.  Dispense: 30 tablet; Refill: 0  [name] Daphine DeutscherMartin, FNP

## 2018-04-20 NOTE — Addendum Note (Signed)
Addended by: Bennie PieriniMARTIN, [name] on: 04/20/2018 04:53 PM   Modules accepted: Orders

## 2018-04-24 LAB — ALLERGEN PROFILE, MOLD
Aspergillus Fumigatus IgE: <0.1 kU/L
Cladosporium Herbarum IgE: <0.1 kU/L
M014-IgE Epicoccum purpur: <0.1 kU/L
Phoma Betae IgE: <0.1 kU/L
Stemphylium Herbarum IgE: <0.1 kU/L

## 2018-08-19 ENCOUNTER — Ambulatory Visit: Payer: BLUE CROSS/BLUE SHIELD | Admitting: Nurse Practitioner

## 2019-03-22 ENCOUNTER — Telehealth: Payer: Self-pay | Admitting: Nurse Practitioner

## 2019-03-22 NOTE — Telephone Encounter (Signed)
Faxed last med list we have on file to number provided.

## 2023-01-09 DIAGNOSIS — E782 Mixed hyperlipidemia: Secondary | ICD-10-CM | POA: Insufficient documentation

## 2023-01-09 DIAGNOSIS — Z6841 Body Mass Index (BMI) 40.0 and over, adult: Secondary | ICD-10-CM | POA: Insufficient documentation

## 2023-05-26 ENCOUNTER — Emergency Department: Payer: PRIVATE HEALTH INSURANCE

## 2023-05-26 ENCOUNTER — Ambulatory Visit: Admission: EM | Admit: 2023-05-26 | Discharge: 2023-05-26 | Payer: PRIVATE HEALTH INSURANCE

## 2023-05-26 ENCOUNTER — Emergency Department
Admission: EM | Admit: 2023-05-26 | Discharge: 2023-05-26 | Disposition: A | Discharge status: Home / Self Care | Payer: PRIVATE HEALTH INSURANCE | Attending: Student in an Organized Health Care Education/Training Program | Admitting: Student in an Organized Health Care Education/Training Program

## 2023-05-26 ENCOUNTER — Encounter: Payer: Self-pay | Admitting: Emergency Medicine

## 2023-05-26 ENCOUNTER — Ambulatory Visit (INDEPENDENT_AMBULATORY_CARE_PROVIDER_SITE_OTHER): Payer: PRIVATE HEALTH INSURANCE

## 2023-05-26 ENCOUNTER — Other Ambulatory Visit: Payer: Self-pay

## 2023-05-26 DIAGNOSIS — J90 Pleural effusion, not elsewhere classified: Secondary | ICD-10-CM

## 2023-05-26 DIAGNOSIS — E782 Mixed hyperlipidemia: Secondary | ICD-10-CM

## 2023-05-26 DIAGNOSIS — I509 Heart failure, unspecified: Secondary | ICD-10-CM | POA: Diagnosis not present

## 2023-05-26 DIAGNOSIS — E876 Hypokalemia: Secondary | ICD-10-CM

## 2023-05-26 DIAGNOSIS — R053 Chronic cough: Secondary | ICD-10-CM | POA: Diagnosis not present

## 2023-05-26 DIAGNOSIS — I517 Cardiomegaly: Secondary | ICD-10-CM | POA: Diagnosis not present

## 2023-05-26 DIAGNOSIS — R059 Cough, unspecified: Secondary | ICD-10-CM | POA: Diagnosis present

## 2023-05-26 DIAGNOSIS — I10 Essential (primary) hypertension: Secondary | ICD-10-CM | POA: Diagnosis not present

## 2023-05-26 LAB — BASIC METABOLIC PANEL
Anion gap: 9 (ref 5–15)
BUN: 14 mg/dL (ref 8–23)
CO2: 25 mmol/L (ref 22–32)
Calcium: 9.2 mg/dL (ref 8.9–10.3)
Chloride: 104 mmol/L (ref 98–111)
Creatinine, Ser: 0.8 mg/dL (ref 0.44–1.00)
GFR, Estimated: >60 mL/min (ref >60)
Glucose, Bld: 103 mg/dL — ABNORMAL HIGH (ref 70–99)
Potassium: 3.9 mmol/L (ref 3.5–5.1)
Sodium: 138 mmol/L (ref 135–145)

## 2023-05-26 LAB — CBC
HCT: 41.4 % (ref 36.0–46.0)
Hemoglobin: 12.6 g/dL (ref 12.0–15.0)
MCH: 25.5 pg — ABNORMAL LOW (ref 26.0–34.0)
MCHC: 30.4 g/dL (ref 30.0–36.0)
MCV: 83.6 fL (ref 80.0–100.0)
Platelets: 339 10*3/uL (ref 150–400)
RBC: 4.95 MIL/uL (ref 3.87–5.11)
RDW: 16 % — ABNORMAL HIGH (ref 11.5–15.5)
WBC: 7.7 10*3/uL (ref 4.0–10.5)
nRBC: 0 % (ref 0.0–0.2)

## 2023-05-26 LAB — TROPONIN I (HIGH SENSITIVITY): Troponin I (High Sensitivity): 14 ng/L (ref <18)

## 2023-05-26 MED ORDER — BUMETANIDE 1 MG PO TABS: 1.0000 mg | ORAL_TABLET | Freq: Every day | ORAL | 0 refills | Status: DC

## 2023-05-26 MED ORDER — ALBUTEROL SULFATE HFA 108 (90 BASE) MCG/ACT IN AERS: 2.0000 | INHALATION_SPRAY | Freq: Four times a day (QID) | RESPIRATORY_TRACT | 2 refills | Status: AC | PRN

## 2023-05-26 MED ORDER — BENZONATATE 100 MG PO CAPS: 100.0000 mg | ORAL_CAPSULE | Freq: Three times a day (TID) | ORAL | 0 refills | Status: DC | PRN

## 2023-05-26 MED ORDER — BENAZEPRIL HCL 40 MG PO TABS: ORAL_TABLET | ORAL | 1 refills | Status: DC

## 2023-05-26 MED ORDER — POTASSIUM CHLORIDE ER 10 MEQ PO TBCR
10.0000 meq | EXTENDED_RELEASE_TABLET | Freq: Every day | ORAL | 1 refills | Status: DC
Start: 2023-05-26 — End: 2024-03-08

## 2023-05-26 MED ORDER — ATORVASTATIN CALCIUM 80 MG PO TABS
ORAL_TABLET | ORAL | 1 refills | Status: DC
Start: 2023-05-26 — End: 2023-12-03

## 2023-05-26 MED ORDER — BENZONATATE 100 MG PO CAPS
100.0000 mg | ORAL_CAPSULE | Freq: Once | ORAL | Status: AC
  Administered 2023-05-26: 100 mg via ORAL
  Filled 2023-05-26: qty 1

## 2023-05-26 MED ORDER — PREDNISONE 20 MG PO TABS
40.0000 mg | ORAL_TABLET | Freq: Once | ORAL | Status: AC
  Administered 2023-05-26: 40 mg via ORAL
  Filled 2023-05-26: qty 2

## 2023-05-26 MED ORDER — PREDNISONE 20 MG PO TABS: 40.0000 mg | ORAL_TABLET | Freq: Every day | ORAL | 0 refills | Status: AC

## 2023-05-26 NOTE — ED Provider Notes (Signed)
[facility]    CSN: [id number] Arrival date & time: 05/26/23  1601      History   Chief Complaint Chief Complaint  Patient presents with   Cough    HPI Tammy Chavez is a 62 y.o. female.   62 year old female,  Tammy Chavez, presents to urgent care for cough x 7 months or longer. Pt states she stopped taking her medication in November, moved here from [place] and has not set up PCP.   Pt denies smoking,vaping,drug or alcohol use.  PMH of HTN,Gout, mixed hyperlipidemia, obesity,anemia,peripheral edema  The history is provided by the patient. No language interpreter was used.    Past Medical History:  Diagnosis Date   Gout    Heel spur    Left   Hyperlipidemia    Hypertension     Patient Active Problem List   Diagnosis Date Noted   Chronic cough 05/26/2023   Cardiomegaly 05/26/2023   Acute congestive heart failure (HCC) 05/26/2023   Pleural effusion on right 05/26/2023   Hypokalemia 10/23/2014   Peripheral edema 10/23/2014   Obesity 10/23/2014   Uncontrolled hypertension 07/25/2011   Gout 07/25/2011   Hyperlipidemia 04/04/2010   Anemia 04/04/2010    History reviewed. No pertinent surgical history.  OB History   No obstetric history on file.      Home Medications    Prior to Admission medications   Medication Sig Start Date End Date Taking? Authorizing Provider  atorvastatin (LIPITOR) 80 MG tablet TAKE 1 BY MOUTH DAILY 02/16/18  Yes Daphine Deutscher, [name], FNP  benazepril (LOTENSIN) 40 MG tablet TAKE 1 BY MOUTH DAILY 02/16/18  Yes Daphine Deutscher, [name], FNP  bumetanide (BUMEX) 1 MG tablet Take 1 mg by mouth daily.   Yes [provider]  allopurinol (ZYLOPRIM) 300 MG tablet TAKE ONE TABLET BY MOUTH ONCE DAILY 11/06/17   Daphine Deutscher, [name], FNP  fenofibrate 160 MG tablet Take 1 tablet (160 mg total) by mouth daily. 02/16/18   Bennie Pierini, FNP  furosemide (LASIX) 40 MG tablet TAKE 1 BY MOUTH DAILY 02/16/18   Daphine Deutscher,  [name], FNP  hydrOXYzine (ATARAX/VISTARIL) 25 MG tablet Take 1 tablet (25 mg total) by mouth 3 (three) times daily as needed. 04/20/18   Daphine Deutscher [name], FNP  indomethacin (INDOCIN) 50 MG capsule Take 1 capsule (50 mg total) by mouth 2 (two) times daily with a meal. 02/16/18   Daphine Deutscher, [name], FNP  levocetirizine (XYZAL) 5 MG tablet Take 1 tablet (5 mg total) by mouth every evening. 02/16/18   Daphine Deutscher, [name], FNP  meloxicam (MOBIC) 15 MG tablet Take 1 tablet (15 mg total) by mouth daily. 04/20/18   Daphine Deutscher, [name], FNP  potassium chloride (KLOR-CON 10) 10 MEQ tablet Take 1 tablet (10 mEq total) by mouth daily. 02/16/18   Bennie Pierini, FNP    Family History Family History  Problem Relation Age of Onset   Heart disease Mother    Diabetes Sister    Hyperlipidemia Brother    Hyperlipidemia Brother    Hyperlipidemia Brother    Diabetes Sister     Social History Social History   Tobacco Use   Smoking status: Never   Smokeless tobacco: Never  Substance Use Topics   Alcohol use: No   Drug use: No     Allergies   Lisinopril   Review of Systems Review of Systems  Constitutional:  Negative for fever.  Respiratory:  Positive for cough and shortness of breath. Negative for chest tightness.   Cardiovascular:  Positive for leg swelling. Negative for chest pain and palpitations.  All other systems reviewed and are negative.    Physical Exam Triage Vital Signs ED Triage Vitals  Encounter Vitals Group     BP      Systolic BP Percentile      Diastolic BP Percentile      Pulse      Resp      Temp      Temp src      SpO2      Weight      Height      Head Circumference      Peak Flow      Pain Score      Pain Loc      Pain Education      Exclude from Growth Chart    No data found.  Updated Vital Signs BP (S) (!) 166/103 (BP Location: Right Arm) Comment: Pt has not been taking any of her meds since November  Pulse 90   Temp 98.6 F  (37 C) (Oral)   LMP 11/03/2013   SpO2 95%   Visual Acuity Right Eye Distance:   Left Eye Distance:   Bilateral Distance:    Right Eye Near:   Left Eye Near:    Bilateral Near:     Physical Exam Vitals and nursing note reviewed.  Constitutional:      Appearance: Normal appearance. She is well-developed. She is obese.  HENT:     Head: Normocephalic.     Nose: Nose normal.     Mouth/Throat:     Lips: Pink.     Mouth: Mucous membranes are moist.     Pharynx: Oropharynx is clear.  Cardiovascular:     Rate and Rhythm: Normal rate and regular rhythm.     Heart sounds: Normal heart sounds.  Pulmonary:     Effort: Pulmonary effort is normal.     Breath sounds: Examination of the right-lower field reveals decreased breath sounds. Examination of the left-lower field reveals decreased breath sounds. Decreased breath sounds present.     Comments: 95% on RA Musculoskeletal:     Right lower leg: Edema present.     Left lower leg: Edema present.  Neurological:     General: No focal deficit present.     Mental Status: She is alert and oriented to person, place, and time.     GCS: GCS eye subscore is 4. GCS verbal subscore is 5. GCS motor subscore is 6.     Cranial Nerves: No cranial nerve deficit.     Sensory: No sensory deficit.  Psychiatric:        Attention and Perception: Attention normal.        Mood and Affect: Mood normal.        Speech: Speech normal.        Behavior: Behavior normal. Behavior is cooperative.      UC Treatments / Results  Labs (all labs ordered are listed, but only abnormal results are displayed) Labs Reviewed - No data to display  EKG   Radiology DG Chest 2 View  Result Date: 05/26/2023 CLINICAL DATA:  Cough x7 months EXAM: CHEST - 2 VIEW COMPARISON:  08/03/2017 FINDINGS: Transverse diameter of heart is increased. Central pulmonary vessels are more prominent. There is subtle increase in interstitial markings in the lower lung fields, more so on  the right side. There is blunting of right lateral CP angle. There is no pneumothorax. IMPRESSION: Cardiomegaly. Central pulmonary  vessels are more prominent suggesting CHF. Increased interstitial markings are seen in the lower lung fields, more so on the right side suggesting possible interstitial pulmonary edema. Small right pleural effusion. Electronically Signed   By: Ernie Avena M.D.   On: 05/26/2023 17:04    Procedures Procedures (including critical care time)  Medications Ordered in UC Medications - No data to display  Initial Impression / Assessment and Plan / UC Course  I have reviewed the triage vital signs and the nursing notes.  Pertinent labs & imaging results that were available during my care of the patient were reviewed by me and considered in my medical decision making (see chart for details).    Discussed exam findings with pt, pt reiterates that her heart doctor in [place] said she was fine, and was advised she had bronchitis with previous urgent care visits, recommend pt go to ER for evaluation and possible admission as she has not been taking her meds for over 8 months, needs labs, cardiac workup, diuresis,etc. Pt verbalized understanding to this provider.   Ddx: CHF,cardiomegaly, pleural effusion,uncontrolled hypertension,chronic cough Final Clinical Impressions(s) / UC Diagnoses   Final diagnoses:  Chronic cough  Uncontrolled hypertension  Cardiomegaly  Acute congestive heart failure, unspecified heart failure type (HCC)  Pleural effusion on right     Discharge Instructions      Please go directly to the ER for further cardiac workup,labs, possible admission  Please get established with PCP of your choice, referral made for PCP. You can also check with your insurance carrier/internet to see whom they recommend in the area.      ED Prescriptions   None    PDMP not reviewed this encounter.   Clancy Gourd, NP 05/26/23 1751

## 2023-05-26 NOTE — Discharge Instructions (Addendum)
Please go directly to the ER for further cardiac workup,labs, possible admission  Please get established with PCP of your choice, referral made for PCP. You can also check with your insurance carrier/internet to see whom they recommend in the area.

## 2023-05-26 NOTE — ED Notes (Signed)
Provided pt with discharge instructions and education. All of pt questions answered. Pt in possession of all belongings. Pt AAOX4 and stable at time of discharge.Pt ambulated w/ steady gait towards ED exit. Pt accompanied by family member.

## 2023-05-26 NOTE — ED Triage Notes (Signed)
Patient to ED from UC for a cough. UC wanted a further evaluation for cardiac workout due to patient not taking regular meds since sister's death in 2023-09-23. Denies chest pain.

## 2023-05-26 NOTE — ED Provider Notes (Signed)
[facility] Provider Note    Event Date/Time   First MD Initiated Contact with Patient 05/26/23 1948     (approximate)   History   Cough   HPI  Tammy Chavez is a 62 y.o. female with a history of bronchitis presents to the ER from urgent care due to being told that she had acute congestive heart failure.  She presented to urgent care due to months of cough that have improved with Tessalon Perles as well as albuterol.  She has not been taking her medications including Bumex or antihypertensive medication since November.  She denies any chest pain.  No pleuritic pain.  No new lower extremity swelling.  She denies any abdominal pain no nausea or vomiting.  She does not smoke.      Physical Exam   Triage Vital Signs: ED Triage Vitals [05/26/23 1820]  Encounter Vitals Group     BP (!) 163/92     Systolic BP Percentile      Diastolic BP Percentile      Pulse Rate 98     Resp 18     Temp 97.7 F (36.5 C)     Temp Source Oral     SpO2 96 %     Weight 261 lb (118.4 kg)     Height 5' (1.524 m)     Head Circumference      Peak Flow      Pain Score 0     Pain Loc      Pain Education      Exclude from Growth Chart     Most recent vital signs: Vitals:   05/26/23 1941 05/26/23 1942  BP: (!) 158/103   Pulse:  93  Resp:    Temp:    SpO2:  96%     Constitutional: Alert  Eyes: Conjunctivae are normal.  Head: Atraumatic. Nose: No congestion/rhinnorhea. Mouth/Throat: Mucous membranes are moist.   Neck: Painless ROM.  Cardiovascular:   Good peripheral circulation. Respiratory: Normal respiratory effort.  Coarse breath sounds throughout.  No significant crackles.  No retractions.  Gastrointestinal: Soft and nontender.  Musculoskeletal:  no deformity Neurologic:  MAE spontaneously. No gross focal neurologic deficits are appreciated.  Skin:  Skin is warm, dry and intact. No rash noted. Psychiatric: Mood and affect are normal. Speech and behavior  are normal.    ED Results / Procedures / Treatments   Labs (all labs ordered are listed, but only abnormal results are displayed) Labs Reviewed  BASIC METABOLIC PANEL - Abnormal; Notable for the following components:      Result Value   Glucose, Bld 103 (*)    All other components within normal limits  CBC - Abnormal; Notable for the following components:   MCH 25.5 (*)    RDW 16.0 (*)    All other components within normal limits  TROPONIN I (HIGH SENSITIVITY)     EKG  ED ECG REPORT I, Willy Eddy, the attending physician, personally viewed and interpreted this ECG.   Date: 05/26/2023  EKG Time: 18:21  Rate: 95  Rhythm: sinus  Axis: right  Intervals: normal  ST&T Change: no stemi, no depressions    RADIOLOGY Please see ED Course for my review and interpretation.  I personally reviewed all radiographic images ordered to evaluate for the above acute complaints and reviewed radiology reports and findings.  These findings were personally discussed with the patient.  Please see medical record for radiology report.  PROCEDURES:  Critical Care performed: No  Procedures   MEDICATIONS ORDERED IN ED: Medications  predniSONE (DELTASONE) tablet 40 mg (has no administration in time range)  benzonatate (TESSALON) capsule 100 mg (has no administration in time range)     IMPRESSION / MDM / ASSESSMENT AND PLAN / ED COURSE  I reviewed the triage vital signs and the nursing notes.                              Differential diagnosis includes, but is not limited to, Asthma, copd, CHF, pna, ptx, malignancy, Pe, anemia  Patient presenting to the ER for evaluation of symptoms as described above.  Based on symptoms, risk factors and considered above differential, this presenting complaint could reflect a potentially life-threatening illness therefore the patient will be placed on continuous pulse oximetry and telemetry for monitoring.  Laboratory evaluation will be sent  to evaluate for the above complaints.  Clinically well-appearing presenting with symptoms ongoing for several months.  She is not showing any signs that would suggest acute heart failure.  Her chest x-ray on my review and interpretation does not show any evidence, consolidation or effusion.  As chronic vascular congestion without evidence of edema.  She has coarse breath sounds that have improved with nebulizers as well as Tessalon Perles.  Do suspect some component of bronchitis.  Does not seem clinically consistent with PE given lack of tachycardia and no hypoxia no pleuritic chest pain no significant risk factors.  May have component of congestive heart failure but does not require admission to the hospital at this time is appropriate for outpatient follow-up with restarting her medications.  Patient is reliable and agreeable to returning to the ER should she have any worsening symptoms.        FINAL CLINICAL IMPRESSION(S) / ED DIAGNOSES   Final diagnoses:  Chronic cough     Rx / DC Orders   ED Discharge Orders          Ordered    atorvastatin (LIPITOR) 80 MG tablet        05/26/23 2013    benazepril (LOTENSIN) 40 MG tablet        05/26/23 2013    bumetanide (BUMEX) 1 MG tablet  Daily        05/26/23 2013    potassium chloride (KLOR-CON 10) 10 MEQ tablet  Daily        05/26/23 2013    albuterol (VENTOLIN HFA) 108 (90 Base) MCG/ACT inhaler  Every 6 hours PRN        05/26/23 2013    predniSONE (DELTASONE) 20 MG tablet  Daily        05/26/23 2013    benzonatate (TESSALON PERLES) 100 MG capsule  3 times daily PRN        05/26/23 2013    Ambulatory Referral to Primary Care (Establish Care)        05/26/23 2014             Note:  This document was prepared using Dragon voice recognition software and may include unintentional dictation errors.    Willy Eddy, MD 05/26/23 2017

## 2023-05-26 NOTE — ED Notes (Signed)
Patient is being discharged from the Urgent Care and sent to the Emergency Department via POV . Per Clancy Gourd, NP, patient is in need of higher level of care due to needing cardiac workup, labs, possible admission. Patient is aware and verbalizes understanding of plan of care.  Vitals:   05/26/23 1618  BP: (S) (!) 166/103  Pulse: 90  Temp: 98.6 F (37 C)  SpO2: 95%

## 2023-05-26 NOTE — ED Triage Notes (Signed)
Pt presents to UC c/o cough onset x7 months ago (probably longer), pt states she was seen once at Christmas and then twice after that and kept getting told it was bronchitis. pt states cough getting worse at  times so bad it makes her SOB.

## 2023-06-08 ENCOUNTER — Encounter: Payer: PRIVATE HEALTH INSURANCE | Admitting: Cardiology

## 2023-06-08 NOTE — Progress Notes (Unsigned)
Advanced Heart Failure Clinic Note   Referring Physician: EDP PCP: Pcp, No PCP-Cardiologist: None   HPI:  Tammy Chavez is a 62 y/o female with a history of HTN, hyperlipidemia and chronic cough.   Was in the ED 05/26/23 due to chronic cough of many months. CXR/ EKG normal.   No echo for review.   She presents today for an initial visit with a chief complaint of   Needs to have echo ordered to see if she even has heart failure.    Review of Systems: [y] = yes, [ ]  = no   General: Weight gain [ ] ; Weight loss [ ] ; Anorexia [ ] ; Fatigue [ ] ; Fever [ ] ; Chills [ ] ; Weakness [ ]   Cardiac: Chest pain/pressure [ ] ; Resting SOB [ ] ; Exertional SOB [ ] ; Orthopnea [ ] ; Pedal Edema [ ] ; Palpitations [ ] ; Syncope [ ] ; Presyncope [ ] ; Paroxysmal nocturnal dyspnea[ ]   Pulmonary: Cough [ ] ; Wheezing[ ] ; Hemoptysis[ ] ; Sputum [ ] ; Snoring [ ]   GI: Vomiting[ ] ; Dysphagia[ ] ; Melena[ ] ; Hematochezia [ ] ; Heartburn[ ] ; Abdominal pain [ ] ; Constipation [ ] ; Diarrhea [ ] ; BRBPR [ ]   GU: Hematuria[ ] ; Dysuria [ ] ; Nocturia[ ]   Vascular: Pain in legs with walking [ ] ; Pain in feet with lying flat [ ] ; Non-healing sores [ ] ; Stroke [ ] ; TIA [ ] ; Slurred speech [ ] ;  Neuro: Headaches[ ] ; Vertigo[ ] ; Seizures[ ] ; Paresthesias[ ] ;Blurred vision [ ] ; Diplopia [ ] ; Vision changes [ ]   Ortho/Skin: Arthritis [ ] ; Joint pain [ ] ; Muscle pain [ ] ; Joint swelling [ ] ; Back Pain [ ] ; Rash [ ]   Psych: Depression[ ] ; Anxiety[ ]   Heme: Bleeding problems [ ] ; Clotting disorders [ ] ; Anemia [ ]   Endocrine: Diabetes [ ] ; Thyroid dysfunction[ ]    Past Medical History:  Diagnosis Date   Gout    Heel spur    Left   Hyperlipidemia    Hypertension     Current Outpatient Medications  Medication Sig Dispense Refill   albuterol (VENTOLIN HFA) 108 (90 Base) MCG/ACT inhaler Inhale 2 puffs into the lungs every 6 (six) hours as needed for wheezing or shortness of breath. 8 g 2   allopurinol (ZYLOPRIM) 300 MG tablet TAKE  ONE TABLET BY MOUTH ONCE DAILY 90 tablet 1   atorvastatin (LIPITOR) 80 MG tablet TAKE 1 BY MOUTH DAILY 90 tablet 1   benazepril (LOTENSIN) 40 MG tablet TAKE 1 BY MOUTH DAILY 90 tablet 1   benzonatate (TESSALON PERLES) 100 MG capsule Take 1 capsule (100 mg total) by mouth 3 (three) times daily as needed for cough. 30 capsule 0   bumetanide (BUMEX) 1 MG tablet Take 1 tablet (1 mg total) by mouth daily. 30 tablet 0   fenofibrate 160 MG tablet Take 1 tablet (160 mg total) by mouth daily. 90 tablet 1   furosemide (LASIX) 40 MG tablet TAKE 1 BY MOUTH DAILY 90 tablet 1   hydrOXYzine (ATARAX/VISTARIL) 25 MG tablet Take 1 tablet (25 mg total) by mouth 3 (three) times daily as needed. 30 tablet 0   indomethacin (INDOCIN) 50 MG capsule Take 1 capsule (50 mg total) by mouth 2 (two) times daily with a meal. 60 capsule 2   levocetirizine (XYZAL) 5 MG tablet Take 1 tablet (5 mg total) by mouth every evening. 30 tablet 0   meloxicam (MOBIC) 15 MG tablet Take 1 tablet (15 mg total) by mouth daily. 30 tablet 0   potassium  chloride (KLOR-CON 10) 10 MEQ tablet Take 1 tablet (10 mEq total) by mouth daily. 90 tablet 1   No current facility-administered medications for this visit.    Allergies  Allergen Reactions   Lisinopril     Cough      Social History   Socioeconomic History   Marital status: Single    Spouse name: Not on file   Number of children: Not on file   Years of education: Not on file   Highest education level: Not on file  Occupational History   Not on file  Tobacco Use   Smoking status: Never   Smokeless tobacco: Never  Substance and Sexual Activity   Alcohol use: No   Drug use: No   Sexual activity: Not on file  Other Topics Concern   Not on file  Social History Narrative   Not on file   Social Determinants of Health   Financial Resource Strain: Not on file  Food Insecurity: Not on file  Transportation Needs: Not on file  Physical Activity: Not on file  Stress: Not on file   Social Connections: Not on file  Intimate Partner Violence: Unknown (11/26/2022)   Received from [facility]   Intimate Partner Violence    Feels physically and emotionally safe: Not on file    Fear of partner: Not on file      Family History  Problem Relation Age of Onset   Heart disease Mother    Diabetes Sister    Hyperlipidemia Brother    Hyperlipidemia Brother    Hyperlipidemia Brother    Diabetes Sister     There were no vitals filed for this visit.   PHYSICAL EXAM: General:  Well appearing. No respiratory difficulty HEENT: normal Neck: supple. no JVD. Carotids 2+ bilat; no bruits. No lymphadenopathy or thyromegaly appreciated. Cor: PMI nondisplaced. Regular rate & rhythm. No rubs, gallops or murmurs. Lungs: clear Abdomen: soft, nontender, nondistended. No hepatosplenomegaly. No bruits or masses. Good bowel sounds. Extremities: no cyanosis, clubbing, rash, edema Neuro: alert & oriented x 3, cranial nerves grossly intact. moves all 4 extremities w/o difficulty. Affect pleasant.  ECG:   ASSESSMENT & PLAN:  1: Chronic cough- - unclear if due to heart failure or not - NYHA class - euvolemic - weighing daily - echo has been scheduled on - continue  - no BNP  2: HTN- - BP - saw PCP in [place] 03/24 before moving here - BMP 05/26/23 showed sodium 138, potassium 3.9, creatinine 0.8 & GFR >60  3: Hyperlipidemia- - continue    Delma Freeze, FNP 06/08/23

## 2023-06-09 ENCOUNTER — Ambulatory Visit (HOSPITAL_BASED_OUTPATIENT_CLINIC_OR_DEPARTMENT_OTHER): Payer: PRIVATE HEALTH INSURANCE | Admitting: Family

## 2023-06-09 ENCOUNTER — Other Ambulatory Visit
Admission: RE | Admit: 2023-06-09 | Discharge: 2023-06-09 | Disposition: A | Discharge status: Home / Self Care | Payer: PRIVATE HEALTH INSURANCE | Source: Ambulatory Visit | Attending: Family | Admitting: Family

## 2023-06-09 ENCOUNTER — Encounter: Payer: Self-pay | Admitting: Family

## 2023-06-09 VITALS — BP 132/93 | HR 63 | Wt 251.0 lb

## 2023-06-09 DIAGNOSIS — R053 Chronic cough: Secondary | ICD-10-CM | POA: Insufficient documentation

## 2023-06-09 DIAGNOSIS — E782 Mixed hyperlipidemia: Secondary | ICD-10-CM | POA: Diagnosis not present

## 2023-06-09 DIAGNOSIS — I10 Essential (primary) hypertension: Secondary | ICD-10-CM | POA: Insufficient documentation

## 2023-06-09 LAB — BASIC METABOLIC PANEL
Anion gap: 14 (ref 5–15)
BUN: 25 mg/dL — ABNORMAL HIGH (ref 8–23)
CO2: 23 mmol/L (ref 22–32)
Calcium: 9.8 mg/dL (ref 8.9–10.3)
Chloride: 103 mmol/L (ref 98–111)
Creatinine, Ser: 0.89 mg/dL (ref 0.44–1.00)
GFR, Estimated: >60 mL/min (ref >60)
Glucose, Bld: 101 mg/dL — ABNORMAL HIGH (ref 70–99)
Potassium: 3.4 mmol/L — ABNORMAL LOW (ref 3.5–5.1)
Sodium: 140 mmol/L (ref 135–145)

## 2023-06-09 LAB — LIPID PANEL
Cholesterol: 191 mg/dL (ref 0–200)
HDL: 43 mg/dL (ref >40)
LDL Cholesterol: 103 mg/dL — ABNORMAL HIGH (ref 0–99)
Total CHOL/HDL Ratio: 4.4 RATIO
Triglycerides: 227 mg/dL — ABNORMAL HIGH (ref <150)
VLDL: 45 mg/dL — ABNORMAL HIGH (ref 0–40)

## 2023-06-09 MED ORDER — LOSARTAN POTASSIUM 50 MG PO TABS: 50.0000 mg | ORAL_TABLET | Freq: Every day | ORAL | 3 refills | Status: DC

## 2023-06-09 MED ORDER — BUMETANIDE 1 MG PO TABS: 1.0000 mg | ORAL_TABLET | Freq: Every day | ORAL | 3 refills | Status: DC

## 2023-06-09 NOTE — Patient Instructions (Addendum)
Begin weighing daily and call for an overnight weight gain of 3 pounds or more or a weekly weight gain of more than 5 pounds.  STOP taking Benazapril  START taking Losartan 50 MG once daily  Call Barbara at [phone] to reschedule your echo appt

## 2023-06-10 ENCOUNTER — Ambulatory Visit: Payer: BLUE CROSS/BLUE SHIELD | Admitting: Nurse Practitioner

## 2023-06-11 ENCOUNTER — Telehealth: Payer: Self-pay

## 2023-06-11 DIAGNOSIS — E782 Mixed hyperlipidemia: Secondary | ICD-10-CM

## 2023-06-11 MED ORDER — FENOFIBRATE 160 MG PO TABS: 160.0000 mg | ORAL_TABLET | Freq: Every day | ORAL | 1 refills | Status: DC

## 2023-06-11 NOTE — Telephone Encounter (Addendum)
  Electa Sniff, RN  Back to Top    Pt aware, agreeable, and verbalized understanding.  Diet and exercise education given with teach back. Pt has been taking atorvastatin for 2 weeks only stated needing a new prescription for fenofibrate. Advised pt to continue taking medication.   Electa Sniff, RN      No answer. Lvm.   Delma Freeze, FNP      Triglycerides are high and LDL (bad cholesterol) is higher than we would like. Ask how long it's been since she resumed her atorvastatin and fenofibrate. Decrease fatty foods and increase activity.

## 2023-06-16 ENCOUNTER — Other Ambulatory Visit (HOSPITAL_COMMUNITY): Payer: Self-pay

## 2023-06-16 ENCOUNTER — Encounter: Payer: Self-pay | Admitting: Family

## 2023-06-16 ENCOUNTER — Telehealth: Payer: Self-pay

## 2023-06-16 ENCOUNTER — Ambulatory Visit: Payer: PRIVATE HEALTH INSURANCE | Attending: Family | Admitting: Family

## 2023-06-16 VITALS — BP 99/74 | HR 54 | Wt 252.0 lb

## 2023-06-16 DIAGNOSIS — E785 Hyperlipidemia, unspecified: Secondary | ICD-10-CM | POA: Insufficient documentation

## 2023-06-16 DIAGNOSIS — R600 Localized edema: Secondary | ICD-10-CM | POA: Insufficient documentation

## 2023-06-16 DIAGNOSIS — I10 Essential (primary) hypertension: Secondary | ICD-10-CM | POA: Insufficient documentation

## 2023-06-16 DIAGNOSIS — I4891 Unspecified atrial fibrillation: Secondary | ICD-10-CM | POA: Diagnosis present

## 2023-06-16 DIAGNOSIS — R053 Chronic cough: Secondary | ICD-10-CM

## 2023-06-16 DIAGNOSIS — Z79899 Other long term (current) drug therapy: Secondary | ICD-10-CM | POA: Insufficient documentation

## 2023-06-16 DIAGNOSIS — R0602 Shortness of breath: Secondary | ICD-10-CM | POA: Diagnosis not present

## 2023-06-16 DIAGNOSIS — E782 Mixed hyperlipidemia: Secondary | ICD-10-CM

## 2023-06-16 DIAGNOSIS — Z7901 Long term (current) use of anticoagulants: Secondary | ICD-10-CM | POA: Diagnosis not present

## 2023-06-16 DIAGNOSIS — I480 Paroxysmal atrial fibrillation: Secondary | ICD-10-CM | POA: Diagnosis not present

## 2023-06-16 MED ORDER — APIXABAN 5 MG PO TABS: 5.0000 mg | ORAL_TABLET | Freq: Two times a day (BID) | ORAL | 6 refills | Status: DC

## 2023-06-16 MED ORDER — APIXABAN 5 MG PO TABS: 5.0000 mg | ORAL_TABLET | Freq: Two times a day (BID) | ORAL | 3 refills | Status: DC

## 2023-06-16 NOTE — TOC Benefit Eligibility Note (Signed)
Patient Product/process development scientist completed.    The patient is insured through  [employer] . Patient has [employer], may use a copay card, and/or apply for patient assistance if available.    Ran test claim for Eliquis 5 mg and the current 30 day co-pay is $35.00.   This test claim was processed through [facility]- copay amounts may vary at other pharmacies due to pharmacy/plan contracts, or as the patient moves through the different stages of their insurance plan.     Roland Earl, CPHT Pharmacy Technician III Certified Patient Advocate [facility] Pharmacy Patient Advocate Team Direct Number: [phone]  Fax: [phone]

## 2023-06-16 NOTE — Telephone Encounter (Signed)
Patient calls to report her apple watch has alarmed twice today with a message stating "atrial fibrillation" and the watch is reporting average heart rate of 126 bpm.  Patient denies any symptoms with this event. She states she doesn't feel any different than normal.  Per [name]: patient can come to office for an EKG to verify rhythm.  Pt instructed if she begins to feel symptomatic patient should go to the Emergency room.   Pt aware, agreeable, and verbalized understanding. She states she will come to office this afternoon.

## 2023-06-16 NOTE — Progress Notes (Signed)
HPI:   PCP: Pcp, No Cardiologist: None   Tammy Chavez is a 62 y/o female with a history of HTN, hyperlipidemia and chronic cough.   Was in the ED 05/26/23 due to chronic cough of many months. CXR/ EKG normal.   No echo for review.   She presents today for an acute visit. She says that today her apple watch has been alarming with an elevated HR as well as an irregular rhythm. She has associated shortness of breath, pedal edema and chronic cough. Has experienced intermittent palpitations but none recently. Denies chest pain, fatigue or weight gain.    Only medication change recently was losartan that was started about a week ago. Mentions that she has a couple of sisters that had atrial fibrillation. Mom had HF and brother had CABG.   ROS: All systems negative except as listed in HPI, PMH and Problem List.  SH:  Social History   Socioeconomic History   Marital status: Single    Spouse name: Not on file   Number of children: Not on file   Years of education: Not on file   Highest education level: Not on file  Occupational History   Not on file  Tobacco Use   Smoking status: Never   Smokeless tobacco: Never  Substance and Sexual Activity   Alcohol use: No   Drug use: No   Sexual activity: Not on file  Other Topics Concern   Not on file  Social History Narrative   Not on file   Social Determinants of Health   Financial Resource Strain: Not on file  Food Insecurity: Not on file  Transportation Needs: Not on file  Physical Activity: Not on file  Stress: Not on file  Social Connections: Not on file  Intimate Partner Violence: Unknown (11/26/2022)   Received from [facility]   Intimate Partner Violence    Feels physically and emotionally safe: Not on file    Fear of partner: Not on file    FH:  Family History  Problem Relation Age of Onset   Heart disease Mother    Diabetes Sister    Hyperlipidemia Brother    Hyperlipidemia Brother    Hyperlipidemia  Brother    Diabetes Sister     Past Medical History:  Diagnosis Date   Gout    Heel spur    Left   Hyperlipidemia    Hypertension     Current Outpatient Medications  Medication Sig Dispense Refill   atorvastatin (LIPITOR) 80 MG tablet TAKE 1 BY MOUTH DAILY 90 tablet 1   benzonatate (TESSALON PERLES) 100 MG capsule Take 1 capsule (100 mg total) by mouth 3 (three) times daily as needed for cough. 30 capsule 0   Biotin 1 MG CAPS Take 1 capsule by mouth daily at 6 (six) AM.     bumetanide (BUMEX) 1 MG tablet Take 1 tablet (1 mg total) by mouth daily. 90 tablet 3   cyanocobalamin (VITAMIN B12) 500 MCG tablet Take 500 mcg by mouth daily.     fenofibrate 160 MG tablet Take 1 tablet (160 mg total) by mouth daily. 90 tablet 1   hydrOXYzine (ATARAX/VISTARIL) 25 MG tablet Take 1 tablet (25 mg total) by mouth 3 (three) times daily as needed. 30 tablet 0   indomethacin (INDOCIN) 50 MG capsule Take 1 capsule (50 mg total) by mouth 2 (two) times daily with a meal. 60 capsule 2   levocetirizine (XYZAL) 5 MG tablet Take 1 tablet (5  mg total) by mouth every evening. 30 tablet 0   losartan (COZAAR) 50 MG tablet Take 1 tablet (50 mg total) by mouth daily. 90 tablet 3   meloxicam (MOBIC) 15 MG tablet Take 1 tablet (15 mg total) by mouth daily. 30 tablet 0   potassium chloride (KLOR-CON 10) 10 MEQ tablet Take 1 tablet (10 mEq total) by mouth daily. 90 tablet 1   albuterol (VENTOLIN HFA) 108 (90 Base) MCG/ACT inhaler Inhale 2 puffs into the lungs every 6 (six) hours as needed for wheezing or shortness of breath. (Patient not taking: Reported on 06/16/2023) 8 g 2   allopurinol (ZYLOPRIM) 300 MG tablet TAKE ONE TABLET BY MOUTH ONCE DAILY (Patient not taking: Reported on 06/09/2023) 90 tablet 1   apixaban (ELIQUIS) 5 MG TABS tablet Take 1 tablet (5 mg total) by mouth 2 (two) times daily. 60 tablet 6   No current facility-administered medications for this visit.    Vitals:   06/16/23 1511  BP: 99/74  Pulse:  (!) 54  SpO2: 100%  Weight: 252 lb (114.3 kg)   Wt Readings from Last 3 Encounters:  06/16/23 252 lb (114.3 kg)  06/09/23 251 lb (113.9 kg)  05/26/23 261 lb (118.4 kg)   Lab Results  Component Value Date   CREATININE 0.89 06/09/2023   CREATININE 0.80 05/26/2023   CREATININE 1.09 (H) 02/16/2018   PHYSICAL EXAM:  General:  Well appearing. No resp difficulty HEENT: normal Neck: supple. JVP flat. No lymphadenopathy or thryomegaly appreciated. Cor: PMI normal. Irregular rhythm, bradycardic. No rubs, gallops or murmurs. Lungs: clear Abdomen: soft, nontender, nondistended. No hepatosplenomegaly. No bruits or masses.  Extremities: no cyanosis, clubbing, rash, 1+ pitting edema bilateral lower legs Neuro: alert & oriented x3, cranial nerves grossly intact. Moves all 4 extremities w/o difficulty. Affect pleasant.   ECG: atrial fib with RVR, HR 117 (personally reviewed)   ASSESSMENT & PLAN:  1: New onset atrial fibrillation- - EKG today is AF RVR with HR 117 - HR initially was 54 - begin eliquis 5mg  BID; copay card given by PharmD today - due to HR 54 and BP 99/74, will not initiate betablocker today - cardiology referral placed  2: Chronic cough- - unclear if due to heart failure or not - NYHA class II - euvolemic - weighing daily and call for an overnight weight gain of > 2 pounds or a weekly weight gain of > 5 pounds - weight stable from last visit here 1 week ago - not adding salt - echo has been scheduled on 07/02/23; pending results may need med changes - continue losartan 50mg   - continue bumex 1mg  daily - continue potassium daily  3: HTN- - BP 99/74 - saw PCP in [place] 03/24 before moving here; has upcoming PCP appt 06/30/23 - BMP 06/09/23 showed sodium 140, potassium 3.4, creatinine 0.89 & GFR >60  4: Hyperlipidemia- - continue atorvastatin 80mg  daily - LDL 06/09/23 was 103  Return 1 week after echo.

## 2023-06-16 NOTE — Progress Notes (Signed)
Medication Samples have been provided to the patient.  Drug name: eliquis       Strength: 5mg         Qty: 1  LOT: [id number]  Exp.Date: SEP2025  Dosing instructions: 1 tablet (5 mg total) by mouth 2 (two) times daily .  The patient has been instructed regarding the correct time, dose, and frequency of taking this medication, including desired effects and most common side effects.   Electa Sniff 3:22 PM 06/16/2023

## 2023-06-16 NOTE — Patient Instructions (Signed)
   Medication Changes:  Start Eliquis 5 mg (1 Tablet) two times a day.   Referrals:  You have been referred to cardiology. You will receive a call to schedule this appointment.  Special Instructions // Education:  Do the following things EVERYDAY: Weigh yourself in the morning before breakfast. Write it down and keep it in a log. Take your medicines as prescribed Eat low salt foods--Limit salt (sodium) to 2000 mg per day.  Stay as active as you can everyday Limit all fluids for the day to less than 2 liters   Follow-Up in: 9/13 with [name] FNP.  Same day ECHO.  Please call our clinic if you need [place] before your appointment.    If you have any questions or concerns before your next appointment please send [place] a message through [place] or call our office at [phone] Monday-Friday 8 am-5 pm.   If you have an urgent need after hours on the weekend please call your Primary Cardiologist or the [facility] in [place] at [phone].

## 2023-06-17 ENCOUNTER — Telehealth: Payer: Self-pay

## 2023-06-18 ENCOUNTER — Ambulatory Visit (HOSPITAL_BASED_OUTPATIENT_CLINIC_OR_DEPARTMENT_OTHER): Payer: PRIVATE HEALTH INSURANCE | Admitting: Cardiology

## 2023-06-18 ENCOUNTER — Other Ambulatory Visit
Admission: RE | Admit: 2023-06-18 | Discharge: 2023-06-18 | Disposition: A | Discharge status: Home / Self Care | Payer: PRIVATE HEALTH INSURANCE | Source: Ambulatory Visit | Attending: Cardiology | Admitting: Cardiology

## 2023-06-18 ENCOUNTER — Encounter: Payer: Self-pay | Admitting: Cardiology

## 2023-06-18 VITALS — BP 103/67 | HR 85 | Ht 60.0 in | Wt 253.0 lb

## 2023-06-18 DIAGNOSIS — E782 Mixed hyperlipidemia: Secondary | ICD-10-CM

## 2023-06-18 DIAGNOSIS — I5032 Chronic diastolic (congestive) heart failure: Secondary | ICD-10-CM | POA: Insufficient documentation

## 2023-06-18 DIAGNOSIS — E669 Obesity, unspecified: Secondary | ICD-10-CM | POA: Insufficient documentation

## 2023-06-18 DIAGNOSIS — G4733 Obstructive sleep apnea (adult) (pediatric): Secondary | ICD-10-CM | POA: Diagnosis not present

## 2023-06-18 DIAGNOSIS — Z7984 Long term (current) use of oral hypoglycemic drugs: Secondary | ICD-10-CM | POA: Insufficient documentation

## 2023-06-18 DIAGNOSIS — Z7901 Long term (current) use of anticoagulants: Secondary | ICD-10-CM | POA: Insufficient documentation

## 2023-06-18 DIAGNOSIS — I480 Paroxysmal atrial fibrillation: Secondary | ICD-10-CM

## 2023-06-18 DIAGNOSIS — Z6841 Body Mass Index (BMI) 40.0 and over, adult: Secondary | ICD-10-CM

## 2023-06-18 LAB — BASIC METABOLIC PANEL
Anion gap: 11 (ref 5–15)
BUN: 20 mg/dL (ref 8–23)
CO2: 28 mmol/L (ref 22–32)
Calcium: 9.4 mg/dL (ref 8.9–10.3)
Chloride: 102 mmol/L (ref 98–111)
Creatinine, Ser: 1.03 mg/dL — ABNORMAL HIGH (ref 0.44–1.00)
GFR, Estimated: >60 mL/min (ref >60)
Glucose, Bld: 101 mg/dL — ABNORMAL HIGH (ref 70–99)
Potassium: 4.1 mmol/L (ref 3.5–5.1)
Sodium: 141 mmol/L (ref 135–145)

## 2023-06-18 LAB — HEMOGLOBIN A1C
Hgb A1c MFr Bld: 6.3 % — ABNORMAL HIGH (ref 4.8–5.6)
Mean Plasma Glucose: 134.11 mg/dL

## 2023-06-18 LAB — MAGNESIUM: Magnesium: 1.9 mg/dL (ref 1.7–2.4)

## 2023-06-18 LAB — BRAIN NATRIURETIC PEPTIDE: B Natriuretic Peptide: 165.8 pg/mL — ABNORMAL HIGH (ref 0.0–100.0)

## 2023-06-18 LAB — TSH: TSH: 3.279 u[IU]/mL (ref 0.350–4.500)

## 2023-06-18 MED ORDER — FARXIGA 10 MG PO TABS: 10.0000 mg | ORAL_TABLET | Freq: Every day | ORAL | 5 refills | Status: DC

## 2023-06-18 MED ORDER — METOPROLOL SUCCINATE ER 25 MG PO TB24: 25.0000 mg | ORAL_TABLET | Freq: Every day | ORAL | 5 refills | Status: DC

## 2023-06-18 NOTE — Telephone Encounter (Signed)
DM said to bring her in to see him this week. That's entirely too long to make her wait for [facility]!   Called pt and scheduled her an appt w/ Dr. Shirlee Latch for 8/22 @ 2:15pm.

## 2023-06-18 NOTE — Progress Notes (Signed)
Patient Name:         DOB:       Height:     Weight:  Office Name:         Referring Provider:  Today's Date:  Date:   STOP BANG RISK ASSESSMENT S (snore) Have you been told that you snore?     YES   T (tired) Are you often tired, fatigued, or sleepy during the day?   YES  O (obstruction) Do you stop breathing, choke, or gasp during sleep? NO   P (pressure) Do you have or are you being treated for high blood pressure? YES   B (BMI) Is your body index greater than 35 kg/m? YES   A (age) Are you 62 years old or older? YES   N (neck) Do you have a neck circumference greater than 16 inches?   YES   G (gender) Are you a female? NO   TOTAL STOP/BANG "YES" ANSWERS 6                                                                       For Office Use Only              Procedure Order Form    YES to 3+ Stop Bang questions OR two clinical symptoms - patient qualifies for WatchPAT (CPT 95800)             Clinical Notes: Will consult Sleep Specialist and refer for management of therapy due to patient increased risk of Sleep Apnea. Ordering a sleep study due to the following two clinical symptoms: Excessive daytime sleepiness G47.10 / Loud snoring R06.83 / Unrefreshed by sleep G47.8 / History of high blood pressure R03.0   I understand that I am proceeding with a home sleep apnea test as ordered by my treating physician. I understand that untreated sleep apnea is a serious cardiovascular risk factor and it is my responsibility to perform the test and seek management for sleep apnea. I will be contacted with the results and be managed for sleep apnea by a local sleep physician. I will be receiving equipment and further instructions from [facility]. I shall promptly ship back the equipment via the included mailing label. I understand my insurance will be billed for the test and as the patient I am responsible for any insurance related out-of-pocket costs incurred. I have been provided  with written instructions and can call for additional video or telephonic instruction, with 24-hour availability of qualified personnel to answer any questions: Patient Help Desk [phone].  Patient Signature ______________________________________________________   Date______________________ Patient Telemedicine Verbal Consent

## 2023-06-18 NOTE — Patient Instructions (Signed)
GO DOWN TO LOWER LEVEL TO HAVE YOUR BLOOD WORK COMPLETED INSIDE OF [facility]ARE  PLEASE DO NOT OPEN YOUR SLEEP STUDY BOX UNTIL WE CALL YOU WITH INSURANCE APPROVAL VERIFICATION  YOUR PIN IS [id number]

## 2023-06-19 ENCOUNTER — Telehealth: Payer: Self-pay | Admitting: Pharmacist

## 2023-06-19 ENCOUNTER — Other Ambulatory Visit (HOSPITAL_COMMUNITY): Payer: Self-pay

## 2023-06-19 ENCOUNTER — Telehealth: Payer: Self-pay

## 2023-06-19 NOTE — Telephone Encounter (Signed)
-----   Message from Nurse Sheryn Bison sent at 06/18/2023  3:55 PM EDT ----- Regarding: semaglutide or tirzepatide Can you please help with this.   Dr. Shirlee Latch wants pt to start semaglutide or tirzepatide for wt loss. For this pt.   Thank  you

## 2023-06-19 NOTE — Telephone Encounter (Signed)
No hx of DM or CAD, A1c yesterday was in prediabetic range. Has Nurse, learning disability. Only possible coverage option will be with weight loss GLP indication. Pharm tech team, please submit prior authorization for Zepbound 2.5mg  SQ once weekly under obesity indication, thank you!

## 2023-06-19 NOTE — Telephone Encounter (Signed)
PA request has been Submitted. New Encounter created for follow up. For additional info see Pharmacy Prior Auth telephone encounter from 06/19/23.

## 2023-06-19 NOTE — Telephone Encounter (Signed)
Pharmacy Patient Advocate Encounter   Received notification from Physician's Office that prior authorization for ZEPBOUND is required/requested.   Insurance verification completed.   The patient is insured through  [employer]  .   Per test claim: PA required; PA submitted to [employer] via CoverMyMeds Key/confirmation #/EOC [id number] Status is pending

## 2023-06-19 NOTE — Progress Notes (Addendum)
PCP: Pcp, No Cardiology: Dr. Shirlee Latch  62 y.o. with history of HTN and gout was referred by Tammy Chavez for evaluation of CHF and atrial fibrillation.  Patient reports about 1.5 years of chronic cough with dyspnea.  Cough generally happens when she lies down at night. Dyspnea walking long distances, no chest pain.  No orthopnea/PND. Main limitation is due to knee pain.   She does, of note, snore and has daytime sleepiness. She was seen by Tammy Chavez earlier this week.  At that time, she was in atrial fibrillation.  She has strong family history of AF.  Today, she is in NSR.    REDS clip 29%  Labs (8/24): K 3.4, creatinine 0.89, LDL 103, ThI 14.   ECG (personally reviewed): NSR, LAFB, poor RWP  FH: Mother with AF and CHF, brother with CABG, sisters with atrial fibrillation.   Social History   Socioeconomic History   Marital status: Single    Spouse name: Not on file   Number of children: Not on file   Years of education: Not on file   Highest education level: Not on file  Occupational History   Not on file  Tobacco Use   Smoking status: Never   Smokeless tobacco: Never  Substance and Sexual Activity   Alcohol use: No   Drug use: No   Sexual activity: Not on file  Other Topics Concern   Not on file  Social History Narrative   Not on file   Social Determinants of Health   Financial Resource Strain: Not on file  Food Insecurity: Not on file  Transportation Needs: Not on file  Physical Activity: Not on file  Stress: Not on file  Social Connections: Not on file  Intimate Partner Violence: Unknown (11/26/2022)   Received from [facility]   Intimate Partner Violence    Feels physically and emotionally safe: Not on file    Fear of partner: Not on file   ROS: All systems reviewed and negative except as per HPI.   Current Outpatient Medications  Medication Sig Dispense Refill   apixaban (ELIQUIS) 5 MG TABS tablet Take 1 tablet (5 mg total) by mouth 2 (two) times  daily. 60 tablet 6   atorvastatin (LIPITOR) 80 MG tablet TAKE 1 BY MOUTH DAILY 90 tablet 1   bumetanide (BUMEX) 1 MG tablet Take 1 tablet (1 mg total) by mouth daily. 90 tablet 3   cyanocobalamin (VITAMIN B12) 500 MCG tablet Take 500 mcg by mouth daily.     FARXIGA 10 MG TABS tablet Take 1 tablet (10 mg total) by mouth daily before breakfast. 30 tablet 5   fenofibrate 160 MG tablet Take 1 tablet (160 mg total) by mouth daily. 90 tablet 1   losartan (COZAAR) 50 MG tablet Take 1 tablet (50 mg total) by mouth daily. 90 tablet 3   metoprolol succinate (TOPROL-XL) 25 MG 24 hr tablet Take 1 tablet (25 mg total) by mouth daily. Take with or immediately following a meal. 30 tablet 5   potassium chloride (KLOR-CON 10) 10 MEQ tablet Take 1 tablet (10 mEq total) by mouth daily. 90 tablet 1   albuterol (VENTOLIN HFA) 108 (90 Base) MCG/ACT inhaler Inhale 2 puffs into the lungs every 6 (six) hours as needed for wheezing or shortness of breath. (Patient not taking: Reported on 06/16/2023) 8 g 2   allopurinol (ZYLOPRIM) 300 MG tablet TAKE ONE TABLET BY MOUTH ONCE DAILY (Patient not taking: Reported on 06/09/2023) 90 tablet 1  benzonatate (TESSALON PERLES) 100 MG capsule Take 1 capsule (100 mg total) by mouth 3 (three) times daily as needed for cough. 30 capsule 0   Biotin 1 MG CAPS Take 1 capsule by mouth daily at 6 (six) AM.     hydrOXYzine (ATARAX/VISTARIL) 25 MG tablet Take 1 tablet (25 mg total) by mouth 3 (three) times daily as needed. 30 tablet 0   indomethacin (INDOCIN) 50 MG capsule Take 1 capsule (50 mg total) by mouth 2 (two) times daily with a meal. 60 capsule 2   levocetirizine (XYZAL) 5 MG tablet Take 1 tablet (5 mg total) by mouth every evening. 30 tablet 0   meloxicam (MOBIC) 15 MG tablet Take 1 tablet (15 mg total) by mouth daily. (Patient not taking: Reported on 06/18/2023) 30 tablet 0   No current facility-administered medications for this visit.   BP 103/67   Pulse 85   Ht 5' (1.524 m)   Wt  253 lb (114.8 kg)   LMP 11/03/2013   SpO2 100%   BMI 49.41 kg/m  General: NAD Neck:JVP 8-9 cm with HJR, no thyromegaly or thyroid nodule.  Lungs: Clear to auscultation bilaterally with normal respiratory effort. CV: Nondisplaced PMI.  Heart regular S1/S2, no S3/S4, no murmur. Trace ankle edema.  No carotid bruit.  Normal pedal pulses.  Abdomen: Soft, nontender, no hepatosplenomegaly, no distention.  Skin: Intact without lesions or rashes.  Neurologic: Alert and oriented x 3.  Psych: Normal affect. Extremities: No clubbing or cyanosis.  HEENT: Normal.   1. Atrial fibrillation: Paroxysmal.  NSR today.   - Continue apixaban 5 mg bid.  - Start Toprol XL 25 mg daily for HR control.  2. Chronic diastolic CHF: Mild volume overload by exam but not by REDS clip.  - I will arrange for echo to fully assess.  - Start Farxiga 10 mg daily. BMET/BNP today, BMET 10 days.  - Continue bumetanide 1 mg daily.  - Echo schedulies.  3. Obesity: Refer to pharmacy clinic for GLP-1 agonist.j 4. HTN: BP controlled.  5. OSA: Strongly suspected.  - I will arrange for sleep study.Tammy Chavez 06/19/2023

## 2023-06-22 NOTE — Telephone Encounter (Signed)
Pharmacy Patient Advocate Encounter  Received notification from  [employer]  that Prior Authorization for ZEPBOUND has been DENIED. Please advise how you'd like to proceed. Full denial letter will be uploaded to the media tab. See denial reason below.  PLAN EXCLUSION

## 2023-06-22 NOTE — Telephone Encounter (Signed)
Zepbound PA denied, insurance does not cover weight loss medications. Called pt, left detailed message.

## 2023-06-27 DIAGNOSIS — Z0000 Encounter for general adult medical examination without abnormal findings: Secondary | ICD-10-CM | POA: Insufficient documentation

## 2023-06-27 NOTE — Progress Notes (Unsigned)
New Patient Office Visit  Subjective    Patient ID: Tammy Chavez, female    DOB: 1961-01-27  Age: 62 y.o. MRN: [id number]  CC: No chief complaint on file.   HPI Tammy Chavez presents to establish care ***  Outpatient Encounter Medications as of 06/30/2023  Medication Sig   albuterol (VENTOLIN HFA) 108 (90 Base) MCG/ACT inhaler Inhale 2 puffs into the lungs every 6 (six) hours as needed for wheezing or shortness of breath. (Patient not taking: Reported on 06/16/2023)   allopurinol (ZYLOPRIM) 300 MG tablet TAKE ONE TABLET BY MOUTH ONCE DAILY (Patient not taking: Reported on 06/09/2023)   apixaban (ELIQUIS) 5 MG TABS tablet Take 1 tablet (5 mg total) by mouth 2 (two) times daily.   atorvastatin (LIPITOR) 80 MG tablet TAKE 1 BY MOUTH DAILY   benzonatate (TESSALON PERLES) 100 MG capsule Take 1 capsule (100 mg total) by mouth 3 (three) times daily as needed for cough.   Biotin 1 MG CAPS Take 1 capsule by mouth daily at 6 (six) AM.   bumetanide (BUMEX) 1 MG tablet Take 1 tablet (1 mg total) by mouth daily.   cyanocobalamin (VITAMIN B12) 500 MCG tablet Take 500 mcg by mouth daily.   FARXIGA 10 MG TABS tablet Take 1 tablet (10 mg total) by mouth daily before breakfast.   fenofibrate 160 MG tablet Take 1 tablet (160 mg total) by mouth daily.   hydrOXYzine (ATARAX/VISTARIL) 25 MG tablet Take 1 tablet (25 mg total) by mouth 3 (three) times daily as needed.   indomethacin (INDOCIN) 50 MG capsule Take 1 capsule (50 mg total) by mouth 2 (two) times daily with a meal.   levocetirizine (XYZAL) 5 MG tablet Take 1 tablet (5 mg total) by mouth every evening.   losartan (COZAAR) 50 MG tablet Take 1 tablet (50 mg total) by mouth daily.   meloxicam (MOBIC) 15 MG tablet Take 1 tablet (15 mg total) by mouth daily. (Patient not taking: Reported on 06/18/2023)   metoprolol succinate (TOPROL-XL) 25 MG 24 hr tablet Take 1 tablet (25 mg total) by mouth daily. Take with or immediately following a meal.    potassium chloride (KLOR-CON 10) 10 MEQ tablet Take 1 tablet (10 mEq total) by mouth daily.   No facility-administered encounter medications on file as of 06/30/2023.    Past Medical History:  Diagnosis Date   Gout    Heel spur    Left   Hyperlipidemia    Hypertension     No past surgical history on file.  Family History  Problem Relation Age of Onset   Heart disease Mother    Diabetes Sister    Hyperlipidemia Brother    Hyperlipidemia Brother    Hyperlipidemia Brother    Diabetes Sister     Social History   Socioeconomic History   Marital status: Single    Spouse name: Not on file   Number of children: Not on file   Years of education: Not on file   Highest education level: Not on file  Occupational History   Not on file  Tobacco Use   Smoking status: Never   Smokeless tobacco: Never  Substance and Sexual Activity   Alcohol use: No   Drug use: No   Sexual activity: Not on file  Other Topics Concern   Not on file  Social History Narrative   Not on file   Social Determinants of Health   Financial Resource Strain: Not on file  Food Insecurity:  Not on file  Transportation Needs: Not on file  Physical Activity: Not on file  Stress: Not on file  Social Connections: Not on file  Intimate Partner Violence: Unknown (11/26/2022)   Received from [facility]   Intimate Partner Violence    Feels physically and emotionally safe: Not on file    Fear of partner: Not on file    ROS Negative unless indicated in HPI   Objective    LMP 11/03/2013   Physical Exam  Last CBC Lab Results  Component Value Date   WBC 7.7 05/26/2023   HGB 12.6 05/26/2023   HCT 41.4 05/26/2023   MCV 83.6 05/26/2023   MCH 25.5 (L) 05/26/2023   RDW 16.0 (H) 05/26/2023   PLT 339 05/26/2023   Last metabolic panel Lab Results  Component Value Date   GLUCOSE 101 (H) 06/18/2023   NA 141 06/18/2023   K 4.1 06/18/2023   CL 102 06/18/2023   CO2 28 06/18/2023   BUN 20  06/18/2023   CREATININE 1.03 (H) 06/18/2023   GFRNONAA >60 06/18/2023   CALCIUM 9.4 06/18/2023   PROT 7.7 02/16/2018   ALBUMIN 4.6 02/16/2018   LABGLOB 3.1 02/16/2018   AGRATIO 1.5 02/16/2018   BILITOT 0.3 02/16/2018   ALKPHOS 62 02/16/2018   AST 34 02/16/2018   ALT 30 02/16/2018   ANIONGAP 11 06/18/2023   Last lipids Lab Results  Component Value Date   CHOL 191 06/09/2023   HDL 43 06/09/2023   LDLCALC 103 (H) 06/09/2023   TRIG 227 (H) 06/09/2023   CHOLHDL 4.4 06/09/2023   Last hemoglobin A1c Lab Results  Component Value Date   HGBA1C 6.3 (H) 06/18/2023   Last thyroid functions Lab Results  Component Value Date   TSH 3.279 06/18/2023   T4TOTAL 10.0 04/18/2015        Assessment & Plan:  There are no diagnoses linked to this encounter.     Continue healthy lifestyle choices, including diet (rich in fruits, vegetables, and lean proteins, and low in salt and simple carbohydrates) and exercise (at least 30 minutes of moderate physical activity daily).     The above assessment and management plan was discussed with the patient. The patient verbalized understanding of and has agreed to the management plan. Patient is aware to call the clinic if they develop any new symptoms or if symptoms persist or worsen. Patient is aware when to return to the clinic for a follow-up visit. Patient educated on when it is appropriate to go to the emergency department.  No follow-ups on file.   Tammy Aran Santa Lighter, DNP [facility] [address] [place], [place] [address] [phone]

## 2023-06-30 ENCOUNTER — Encounter: Payer: Self-pay | Admitting: Nurse Practitioner

## 2023-06-30 ENCOUNTER — Telehealth: Payer: Self-pay | Admitting: Nurse Practitioner

## 2023-06-30 ENCOUNTER — Ambulatory Visit (INDEPENDENT_AMBULATORY_CARE_PROVIDER_SITE_OTHER): Payer: PRIVATE HEALTH INSURANCE | Admitting: Nurse Practitioner

## 2023-06-30 VITALS — BP 125/75 | HR 77 | Temp 97.8°F | Ht 60.0 in | Wt 253.8 lb

## 2023-06-30 DIAGNOSIS — M1A09X0 Idiopathic chronic gout, multiple sites, without tophus (tophi): Secondary | ICD-10-CM

## 2023-06-30 DIAGNOSIS — I4891 Unspecified atrial fibrillation: Secondary | ICD-10-CM | POA: Diagnosis not present

## 2023-06-30 DIAGNOSIS — Z1231 Encounter for screening mammogram for malignant neoplasm of breast: Secondary | ICD-10-CM | POA: Insufficient documentation

## 2023-06-30 DIAGNOSIS — Z1211 Encounter for screening for malignant neoplasm of colon: Secondary | ICD-10-CM | POA: Insufficient documentation

## 2023-06-30 DIAGNOSIS — Z6841 Body Mass Index (BMI) 40.0 and over, adult: Secondary | ICD-10-CM

## 2023-06-30 DIAGNOSIS — Z0000 Encounter for general adult medical examination without abnormal findings: Secondary | ICD-10-CM

## 2023-06-30 DIAGNOSIS — Z0001 Encounter for general adult medical examination with abnormal findings: Secondary | ICD-10-CM

## 2023-06-30 DIAGNOSIS — R7303 Prediabetes: Secondary | ICD-10-CM

## 2023-06-30 LAB — BAYER DCA HB A1C WAIVED: HB A1C (BAYER DCA - WAIVED): 6.2 % — ABNORMAL HIGH (ref 4.8–5.6)

## 2023-06-30 MED ORDER — ALLOPURINOL 300 MG PO TABS
ORAL_TABLET | ORAL | 1 refills | Status: DC
Start: 2023-06-30 — End: 2024-02-29

## 2023-06-30 NOTE — Telephone Encounter (Signed)
Pt wants lab orders added for her new patient appt so that she can come in before her appt today at 12. Please add future lab order and let pt know when its been added.

## 2023-07-01 ENCOUNTER — Other Ambulatory Visit: Payer: Self-pay

## 2023-07-01 DIAGNOSIS — Z1231 Encounter for screening mammogram for malignant neoplasm of breast: Secondary | ICD-10-CM

## 2023-07-01 LAB — CBC WITH DIFFERENTIAL/PLATELET
Basophils Absolute: 0 10*3/uL (ref 0.0–0.2)
Basos: 0 %
EOS (ABSOLUTE): 0.1 10*3/uL (ref 0.0–0.4)
Eos: 2 %
Hematocrit: 43.4 % (ref 34.0–46.6)
Hemoglobin: 13.9 g/dL (ref 11.1–15.9)
Immature Grans (Abs): 0 10*3/uL (ref 0.0–0.1)
Immature Granulocytes: 0 %
Lymphocytes Absolute: 2.1 10*3/uL (ref 0.7–3.1)
Lymphs: 28 %
MCH: 26 pg — ABNORMAL LOW (ref 26.6–33.0)
MCHC: 32 g/dL (ref 31.5–35.7)
MCV: 81 fL (ref 79–97)
Monocytes Absolute: 0.4 10*3/uL (ref 0.1–0.9)
Monocytes: 6 %
Neutrophils Absolute: 4.9 10*3/uL (ref 1.4–7.0)
Neutrophils: 64 %
Platelets: 345 10*3/uL (ref 150–450)
RBC: 5.34 x10E6/uL — ABNORMAL HIGH (ref 3.77–5.28)
RDW: 17.9 % — ABNORMAL HIGH (ref 11.7–15.4)
WBC: 7.6 10*3/uL (ref 3.4–10.8)

## 2023-07-01 LAB — THYROID PANEL WITH TSH
Free Thyroxine Index: 2.4 (ref 1.2–4.9)
T3 Uptake Ratio: 24 % (ref 24–39)
T4, Total: 9.8 ug/dL (ref 4.5–12.0)
TSH: 3.88 u[IU]/mL (ref 0.450–4.500)

## 2023-07-01 LAB — LIPID PANEL
Chol/HDL Ratio: 3.7 ratio (ref 0.0–4.4)
Cholesterol, Total: 149 mg/dL (ref 100–199)
HDL: 40 mg/dL (ref >39)
LDL Chol Calc (NIH): 80 mg/dL (ref 0–99)
Triglycerides: 172 mg/dL — ABNORMAL HIGH (ref 0–149)
VLDL Cholesterol Cal: 29 mg/dL (ref 5–40)

## 2023-07-02 ENCOUNTER — Ambulatory Visit: Payer: PRIVATE HEALTH INSURANCE

## 2023-07-02 LAB — CMP14+EGFR
ALT: 18 IU/L (ref 0–32)
AST: 26 IU/L (ref 0–40)
Albumin: 4.3 g/dL (ref 3.9–4.9)
Alkaline Phosphatase: 73 IU/L (ref 44–121)
BUN/Creatinine Ratio: 18 (ref 12–28)
BUN: 22 mg/dL (ref 8–27)
Bilirubin Total: 0.3 mg/dL (ref 0.0–1.2)
CO2: 24 mmol/L (ref 20–29)
Calcium: 9.8 mg/dL (ref 8.7–10.3)
Chloride: 103 mmol/L (ref 96–106)
Creatinine, Ser: 1.23 mg/dL — ABNORMAL HIGH (ref 0.57–1.00)
Globulin, Total: 2.8 g/dL (ref 1.5–4.5)
Glucose: 90 mg/dL (ref 70–99)
Potassium: 4.2 mmol/L (ref 3.5–5.2)
Sodium: 145 mmol/L — ABNORMAL HIGH (ref 134–144)
Total Protein: 7.1 g/dL (ref 6.0–8.5)
eGFR: 50 mL/min/{1.73_m2} — ABNORMAL LOW (ref >59)

## 2023-07-02 LAB — SPECIMEN STATUS REPORT

## 2023-07-09 NOTE — Progress Notes (Signed)
PCP: [name], NP  (last seen 09/24) Cardiologist: None  HF MD: Marca Ancona, MD (last seen 08/24)  HPI:  Tammy Chavez is a 62 y/o female with a history of HTN, hyperlipidemia, atrial fibrillation (08/24) and chronic cough.   Was in the ED 05/26/23 due to chronic cough of many months. CXR/ EKG normal.   Had echo done earlier today but no results available at the time of her appointment.  She presents today for a HF visit with a chief complaint of minimal fatigue with moderate exertion. Has associated chronic dry cough (for > 1 yr) which is still worse at night when laying down. Denies SOB, chest pain, palpitations, abdominal distention, pedal edema, dizziness or difficulty sleeping.   At last visit, metoprolol succinate 25mg  & farxiga 10mg  was added. No issues with taking the medications.   ROS: All systems negative except as listed in HPI, PMH and Problem List.  SH:  Social History   Socioeconomic History   Marital status: Single    Spouse name: Not on file   Number of children: Not on file   Years of education: Not on file   Highest education level: Not on file  Occupational History   Not on file  Tobacco Use   Smoking status: Never   Smokeless tobacco: Never  Substance and Sexual Activity   Alcohol use: No   Drug use: No   Sexual activity: Not on file  Other Topics Concern   Not on file  Social History Narrative   Not on file   Social Determinants of Health   Financial Resource Strain: Not on file  Food Insecurity: Not on file  Transportation Needs: Not on file  Physical Activity: Not on file  Stress: Not on file  Social Connections: Not on file  Intimate Partner Violence: Unknown (11/26/2022)   Received from [facility]   Intimate Partner Violence    Feels physically and emotionally safe: Not on file    Fear of partner: Not on file    FH:  Family History  Problem Relation Age of Onset   Heart disease Mother    Diabetes Sister     Hyperlipidemia Brother    Hyperlipidemia Brother    Hyperlipidemia Brother    Diabetes Sister     Past Medical History:  Diagnosis Date   Gout    Heel spur    Left   Hyperlipidemia    Hypertension     Current Outpatient Medications  Medication Sig Dispense Refill   albuterol (VENTOLIN HFA) 108 (90 Base) MCG/ACT inhaler Inhale 2 puffs into the lungs every 6 (six) hours as needed for wheezing or shortness of breath. 8 g 2   allopurinol (ZYLOPRIM) 300 MG tablet TAKE ONE TABLET BY MOUTH ONCE DAILY 90 tablet 1   apixaban (ELIQUIS) 5 MG TABS tablet Take 1 tablet (5 mg total) by mouth 2 (two) times daily. 60 tablet 6   atorvastatin (LIPITOR) 80 MG tablet TAKE 1 BY MOUTH DAILY 90 tablet 1   Biotin 1 MG CAPS Take 1 capsule by mouth daily at 6 (six) AM.     bumetanide (BUMEX) 1 MG tablet Take 1 tablet (1 mg total) by mouth daily. 90 tablet 3   cyanocobalamin (VITAMIN B12) 500 MCG tablet Take 500 mcg by mouth daily.     FARXIGA 10 MG TABS tablet Take 1 tablet (10 mg total) by mouth daily before breakfast. 30 tablet 5   fenofibrate 160 MG tablet Take  1 tablet (160 mg total) by mouth daily. 90 tablet 1   losartan (COZAAR) 50 MG tablet Take 1 tablet (50 mg total) by mouth daily. 90 tablet 3   metoprolol succinate (TOPROL-XL) 25 MG 24 hr tablet Take 1 tablet (25 mg total) by mouth daily. Take with or immediately following a meal. 30 tablet 5   potassium chloride (KLOR-CON 10) 10 MEQ tablet Take 1 tablet (10 mEq total) by mouth daily. 90 tablet 1   No current facility-administered medications for this visit.   Vitals:   07/10/23 1327  BP: (!) 117/58  Pulse: 80  SpO2: 98%  Weight: 253 lb (114.8 kg)  Height: 5' (1.524 m)   Wt Readings from Last 3 Encounters:  07/10/23 253 lb (114.8 kg)  06/30/23 253 lb 12.8 oz (115.1 kg)  06/18/23 253 lb (114.8 kg)   Lab Results  Component Value Date   CREATININE 1.23 (H) 06/30/2023   CREATININE 1.03 (H) 06/18/2023   CREATININE 0.89 06/09/2023    PHYSICAL EXAM:  General:  Well appearing. No resp difficulty HEENT: normal Neck: supple. JVP flat. No lymphadenopathy or thryomegaly appreciated. Cor: PMI normal. Normal rate/ rhythm. No rubs, gallops or murmurs. Lungs: clear Abdomen: soft, nontender, nondistended. No hepatosplenomegaly. No bruits or masses.  Extremities: no cyanosis, clubbing, rash, edema Neuro: alert & oriented x3, cranial nerves grossly intact. Moves all 4 extremities w/o difficulty. Affect pleasant.   ECG: NSR HR 73 (personally reviewed)   ASSESSMENT & PLAN:  1: Chronic diastolic heart failure- - unclear of etiology at this time; work up in progress - NYHA class II - euvolemic - weighing daily; reminded to call for an overnight weight gain of > 2 pounds or a weekly weight gain of > 5 pounds - weight unchanged from last visit here 3 weeks ago - not adding salt - echo done earlier today but currently no results; will call patient once results received - continue losartan 50mg  daily - continue bumex 1mg  daily - continue potassium daily - continue farxiga 10mg  daily - continue metoprolol succinate 25mg  daily - consider spironolactone at next visit with stoppage of daily bumex/ potassium - she says that her insurance did not approve any GLP1 because she doesn't have diabetes  2: Paroxysmal atrial fibrillation- - continue eliquis 5mg  BID - continue metoprolol succinate 25mg  daily - EKG today is NSR, HR 73  3: HTN- - BP 117/58 - saw PCP ([facility]) 09/24 - BMP 06/30/23 showed sodium 145, potassium 4.2, creatinine 1.23 & GFR 50  4: Hyperlipidemia- - continue atorvastatin 80mg  daily - LDL 06/09/23 was 103  5: Snoring-  - has home sleep study kit and is waiting insurance approval  Return in 2 weeks, sooner if needed.

## 2023-07-10 ENCOUNTER — Ambulatory Visit
Admission: RE | Admit: 2023-07-10 | Discharge: 2023-07-10 | Disposition: A | Discharge status: Home / Self Care | Payer: PRIVATE HEALTH INSURANCE | Source: Ambulatory Visit | Attending: Family

## 2023-07-10 ENCOUNTER — Ambulatory Visit (HOSPITAL_BASED_OUTPATIENT_CLINIC_OR_DEPARTMENT_OTHER): Payer: PRIVATE HEALTH INSURANCE | Admitting: Family

## 2023-07-10 ENCOUNTER — Encounter: Payer: Self-pay | Admitting: Family

## 2023-07-10 VITALS — BP 117/58 | HR 80 | Ht 60.0 in | Wt 253.0 lb

## 2023-07-10 DIAGNOSIS — R0683 Snoring: Secondary | ICD-10-CM | POA: Diagnosis not present

## 2023-07-10 DIAGNOSIS — I480 Paroxysmal atrial fibrillation: Secondary | ICD-10-CM

## 2023-07-10 DIAGNOSIS — R9431 Abnormal electrocardiogram [ECG] [EKG]: Secondary | ICD-10-CM | POA: Diagnosis not present

## 2023-07-10 DIAGNOSIS — I110 Hypertensive heart disease with heart failure: Secondary | ICD-10-CM | POA: Diagnosis not present

## 2023-07-10 DIAGNOSIS — I10 Essential (primary) hypertension: Secondary | ICD-10-CM

## 2023-07-10 DIAGNOSIS — I5032 Chronic diastolic (congestive) heart failure: Secondary | ICD-10-CM | POA: Diagnosis not present

## 2023-07-10 DIAGNOSIS — E782 Mixed hyperlipidemia: Secondary | ICD-10-CM

## 2023-07-10 DIAGNOSIS — Z7901 Long term (current) use of anticoagulants: Secondary | ICD-10-CM | POA: Insufficient documentation

## 2023-07-10 DIAGNOSIS — I4891 Unspecified atrial fibrillation: Secondary | ICD-10-CM | POA: Diagnosis present

## 2023-07-10 DIAGNOSIS — R053 Chronic cough: Secondary | ICD-10-CM | POA: Diagnosis present

## 2023-07-10 DIAGNOSIS — E785 Hyperlipidemia, unspecified: Secondary | ICD-10-CM | POA: Insufficient documentation

## 2023-07-10 LAB — ECHOCARDIOGRAM COMPLETE
AR max vel: 1.17 cm2
AV Area VTI: 1.21 cm2
AV Area mean vel: 1.07 cm2
AV Mean grad: 5.7 mmHg
AV Peak grad: 9 mmHg
Ao pk vel: 1.5 m/s
Area-P 1/2: 4.41 cm2
Calc EF: 19.8 %
MV VTI: 1.17 cm2
S' Lateral: 4.9 cm
Single Plane A2C EF: 13 %
Single Plane A4C EF: 27.7 %

## 2023-07-10 NOTE — Patient Instructions (Addendum)
We will call you when we get your echo results back.   Safe travels to [place]!

## 2023-07-13 ENCOUNTER — Telehealth: Payer: Self-pay

## 2023-07-13 DIAGNOSIS — I5032 Chronic diastolic (congestive) heart failure: Secondary | ICD-10-CM

## 2023-07-13 MED ORDER — SPIRONOLACTONE 25 MG PO TABS: 12.5000 mg | ORAL_TABLET | Freq: Every day | ORAL | 3 refills | Status: DC

## 2023-07-13 NOTE — Telephone Encounter (Addendum)
  Pt aware, agreeable, and verbalized understanding Medication sent to requested pharmacy. Lab work orders placed.   ----- Message from Delma Freeze sent at 07/13/2023 11:06 AM EDT ----- Heart muscle is squeezing weaker than it should. Normal pumping ability should be 50% or higher and yours is 20-25%. Mild mitral regurgitation but the heart muscle itself is not enlarged, which is good news. The reduced heart function could be related to atrial fibrillation and/ or possibly sleep apnea. We will continue to adjust your medications that can help the heart function improve. Will add spironolactone 12.5mg  once daily and when you start this medication, you can stop taking your potassium because spironolactone can help your body hold onto potassium. Wait until return from [place] because BMET needs checked 1 week after starting this. Keep f/u with Dr Shirlee Latch.

## 2023-07-13 NOTE — Addendum Note (Signed)
Addended by: Electa Sniff on: 07/13/2023 03:30 PM   Modules accepted: Orders

## 2023-07-20 ENCOUNTER — Other Ambulatory Visit: Payer: Self-pay

## 2023-07-20 DIAGNOSIS — I5032 Chronic diastolic (congestive) heart failure: Secondary | ICD-10-CM

## 2023-07-24 ENCOUNTER — Other Ambulatory Visit: Payer: PRIVATE HEALTH INSURANCE

## 2023-07-24 DIAGNOSIS — I5032 Chronic diastolic (congestive) heart failure: Secondary | ICD-10-CM

## 2023-07-24 LAB — BASIC METABOLIC PANEL
BUN/Creatinine Ratio: 21 (ref 12–28)
BUN: 24 mg/dL (ref 8–27)
CO2: 24 mmol/L (ref 20–29)
Calcium: 10.1 mg/dL (ref 8.7–10.3)
Chloride: 98 mmol/L (ref 96–106)
Creatinine, Ser: 1.12 mg/dL — ABNORMAL HIGH (ref 0.57–1.00)
Glucose: 95 mg/dL (ref 70–99)
Potassium: 4.2 mmol/L (ref 3.5–5.2)
Sodium: 137 mmol/L (ref 134–144)
eGFR: 56 mL/min/{1.73_m2} — ABNORMAL LOW (ref >59)

## 2023-07-27 ENCOUNTER — Ambulatory Visit: Payer: PRIVATE HEALTH INSURANCE | Attending: Cardiology | Admitting: Cardiology

## 2023-07-27 VITALS — BP 101/51 | HR 81 | Wt 252.0 lb

## 2023-07-27 DIAGNOSIS — I5032 Chronic diastolic (congestive) heart failure: Secondary | ICD-10-CM | POA: Diagnosis not present

## 2023-07-27 MED ORDER — ENTRESTO 24-26 MG PO TABS: 1.0000 | ORAL_TABLET | Freq: Two times a day (BID) | ORAL | 3 refills | Status: DC

## 2023-07-27 NOTE — H&P (View-Only) (Signed)
PCP: [name], NP Cardiology: Dr. Shirlee Latch  62 y.o. with history of HTN and gout was referred by [name] for evaluation of CHF and atrial fibrillation.  She was initially seen by [facility] for a chronic cough and mild dyspnea.  At her initial visit, she was in atrial fibrillation, but this resolved spontaneously, and she has had no obvious recurrence.  She has strong family history of AF.  Echo was done in 9/24, this showed EF 20-25%, mild LV dilation, grade 2 diastolic dysfunction, low normal RV function, mild MR, IVC normal.  Patient does not drink or use drugs.  She has family history of CHF and CAD (brother with CABG).    She returns for followup of cardiomyopathy of uncertain etiology.  Cough is improved.  She is short of breath walking long distances or up hills but is more limited by hip pain.  No orthopnea/PND.  She gets "indigestion" at times, no particular trigger.  This feels like heartburn and is not exertional.  She denies palpitations and remains in NSR today.  No orthopnea/PND.  No lightheadedness. She has a home sleep study but has not done it yet.  Insurance would not cover GLP-1 agonist for her. Weight is down 1 lb.   Labs (8/24): K 3.4, creatinine 0.89, LDL 103. Labs (9/24): K 4.2, creatinine 1.12, TSH normal, LDL 80, TGs 172   ECG (personally reviewed): NSR, normal  PMH: 1. HTN 2. Gout 3. Atrial fibrillation: Paroxysmal 4. Chronic systolic CHF: Echo (9/24) with EF 20-25%, mild LV dilation, grade 2 diastolic dysfunction, low normal RV function, mild MR, IVC normal.  5. Hyperlipidemia  FH: Mother with AF and CHF, brother with CABG, sisters with atrial fibrillation.   Social History   Socioeconomic History   Marital status: Single    Spouse name: Not on file   Number of children: Not on file   Years of education: Not on file   Highest education level: Not on file  Occupational History   Not on file  Tobacco Use   Smoking status: Never    Smokeless tobacco: Never  Substance and Sexual Activity   Alcohol use: No   Drug use: No   Sexual activity: Not on file  Other Topics Concern   Not on file  Social History Narrative   Not on file   Social Determinants of Health   Financial Resource Strain: Not on file  Food Insecurity: Not on file  Transportation Needs: Not on file  Physical Activity: Not on file  Stress: Not on file  Social Connections: Not on file  Intimate Partner Violence: Unknown (11/26/2022)   Received from [facility]   Intimate Partner Violence    Feels physically and emotionally safe: Not on file    Fear of partner: Not on file   ROS: All systems reviewed and negative except as per HPI.   Current Outpatient Medications  Medication Sig Dispense Refill   albuterol (VENTOLIN HFA) 108 (90 Base) MCG/ACT inhaler Inhale 2 puffs into the lungs every 6 (six) hours as needed for wheezing or shortness of breath. 8 g 2   allopurinol (ZYLOPRIM) 300 MG tablet TAKE ONE TABLET BY MOUTH ONCE DAILY 90 tablet 1   apixaban (ELIQUIS) 5 MG TABS tablet Take 1 tablet (5 mg total) by mouth 2 (two) times daily. 60 tablet 6   atorvastatin (LIPITOR) 80 MG tablet TAKE 1 BY MOUTH DAILY 90 tablet 1   Biotin 1 MG CAPS Take 1  capsule by mouth daily at 6 (six) AM.     bumetanide (BUMEX) 1 MG tablet Take 1 tablet (1 mg total) by mouth daily. 90 tablet 3   cyanocobalamin (VITAMIN B12) 500 MCG tablet Take 500 mcg by mouth daily.     FARXIGA 10 MG TABS tablet Take 1 tablet (10 mg total) by mouth daily before breakfast. 30 tablet 5   fenofibrate 160 MG tablet Take 1 tablet (160 mg total) by mouth daily. 90 tablet 1   metoprolol succinate (TOPROL-XL) 25 MG 24 hr tablet Take 1 tablet (25 mg total) by mouth daily. Take with or immediately following a meal. 30 tablet 5   potassium chloride (KLOR-CON 10) 10 MEQ tablet Take 1 tablet (10 mEq total) by mouth daily. 90 tablet 1   spironolactone (ALDACTONE) 25 MG tablet Take 0.5 tablets  (12.5 mg total) by mouth daily. 45 tablet 3   sacubitril-valsartan (ENTRESTO) 24-26 MG Take 1 tablet by mouth 2 (two) times daily. 180 tablet 3   No current facility-administered medications for this visit.   BP (!) 101/51   Pulse 81   Wt 252 lb (114.3 kg)   LMP 11/03/2013   SpO2 98%   BMI 49.22 kg/m  General: NAD Neck: JVP 8 cm, no thyromegaly or thyroid nodule.  Lungs: Clear to auscultation bilaterally with normal respiratory effort. CV: Nondisplaced PMI.  Heart regular S1/S2, no S3/S4, no murmur.  Trace ankle edema.  No carotid bruit.  Normal pedal pulses.  Abdomen: Soft, nontender, no hepatosplenomegaly, no distention.  Skin: Intact without lesions or rashes.  Neurologic: Alert and oriented x 3.  Psych: Normal affect. Extremities: No clubbing or cyanosis.  HEENT: Normal.   1. Atrial fibrillation: Paroxysmal.  NSR today.  Atrial fibrillation is more likely a product of her cardiomyopathy than the cause as she seems to be in AF only rarely.  She denies palpitations. - Continue apixaban 5 mg bid.  2. Chronic systolic CHF: Echo in 9/24 with EF 20-25%, mild LV dilation, grade 2 diastolic dysfunction, low normal RV function, mild MR, IVC normal.  Cause of cardiomyopathy is uncertain.  No substance abuse history.  Main symptoms have been chronic cough and mild dyspnea x weeks to months, NYHA class II symptoms.  She appears minimally volume overloaded on exam.  - She will need RHC/LHC to assess filling pressures and cardiac output as well as to definitively rule out CAD as cause of her cardiomyopathy. She has RFs for CAD: family history, HTN, hyperlipidemia and she has atypical chest pain.  We discussed risks/benefits and she agrees to procedure. She will hold apixaban day before/day of.  - If she does not have significant coronary disease by cath, she will need cardiac MRI to look for infiltrative disease/prior myocarditis.  - Continue Farxiga 10 mg daily.  - Stop losartan, start Entresto  24/26 bid.  This will add a little extra diuresis.  BMET/BNP today, BMET in 10 days.  - Continue bumetanide 1 mg daily.  - Continue Toprol XL 25 mg daily - Continue spironolactone 12.5 mg daily.  3. Obesity: Unable to get coverage for GLP-1 agonist.  4. HTN: BP controlled.  5. OSA: Strongly suspected.  - She is going to do a home sleep study.   Followup in 3 wks with HF pharmacist.  See me in 6 wks.   Marca Ancona 07/27/2023

## 2023-07-27 NOTE — Progress Notes (Signed)
PCP: [name], NP Cardiology: Dr. Shirlee Latch  62 y.o. with history of HTN and gout was referred by [name] for evaluation of CHF and atrial fibrillation.  She was initially seen by [facility] for a chronic cough and mild dyspnea.  At her initial visit, she was in atrial fibrillation, but this resolved spontaneously, and she has had no obvious recurrence.  She has strong family history of AF.  Echo was done in 9/24, this showed EF 20-25%, mild LV dilation, grade 2 diastolic dysfunction, low normal RV function, mild MR, IVC normal.  Patient does not drink or use drugs.  She has family history of CHF and CAD (brother with CABG).    She returns for followup of cardiomyopathy of uncertain etiology.  Cough is improved.  She is short of breath walking long distances or up hills but is more limited by hip pain.  No orthopnea/PND.  She gets "indigestion" at times, no particular trigger.  This feels like heartburn and is not exertional.  She denies palpitations and remains in NSR today.  No orthopnea/PND.  No lightheadedness. She has a home sleep study but has not done it yet.  Insurance would not cover GLP-1 agonist for her. Weight is down 1 lb.   Labs (8/24): K 3.4, creatinine 0.89, LDL 103. Labs (9/24): K 4.2, creatinine 1.12, TSH normal, LDL 80, TGs 172   ECG (personally reviewed): NSR, normal  PMH: 1. HTN 2. Gout 3. Atrial fibrillation: Paroxysmal 4. Chronic systolic CHF: Echo (9/24) with EF 20-25%, mild LV dilation, grade 2 diastolic dysfunction, low normal RV function, mild MR, IVC normal.  5. Hyperlipidemia  FH: Mother with AF and CHF, brother with CABG, sisters with atrial fibrillation.   Social History   Socioeconomic History   Marital status: Single    Spouse name: Not on file   Number of children: Not on file   Years of education: Not on file   Highest education level: Not on file  Occupational History   Not on file  Tobacco Use   Smoking status: Never    Smokeless tobacco: Never  Substance and Sexual Activity   Alcohol use: No   Drug use: No   Sexual activity: Not on file  Other Topics Concern   Not on file  Social History Narrative   Not on file   Social Determinants of Health   Financial Resource Strain: Not on file  Food Insecurity: Not on file  Transportation Needs: Not on file  Physical Activity: Not on file  Stress: Not on file  Social Connections: Not on file  Intimate Partner Violence: Unknown (11/26/2022)   Received from [facility]   Intimate Partner Violence    Feels physically and emotionally safe: Not on file    Fear of partner: Not on file   ROS: All systems reviewed and negative except as per HPI.   Current Outpatient Medications  Medication Sig Dispense Refill   albuterol (VENTOLIN HFA) 108 (90 Base) MCG/ACT inhaler Inhale 2 puffs into the lungs every 6 (six) hours as needed for wheezing or shortness of breath. 8 g 2   allopurinol (ZYLOPRIM) 300 MG tablet TAKE ONE TABLET BY MOUTH ONCE DAILY 90 tablet 1   apixaban (ELIQUIS) 5 MG TABS tablet Take 1 tablet (5 mg total) by mouth 2 (two) times daily. 60 tablet 6   atorvastatin (LIPITOR) 80 MG tablet TAKE 1 BY MOUTH DAILY 90 tablet 1   Biotin 1 MG CAPS Take 1  capsule by mouth daily at 6 (six) AM.     bumetanide (BUMEX) 1 MG tablet Take 1 tablet (1 mg total) by mouth daily. 90 tablet 3   cyanocobalamin (VITAMIN B12) 500 MCG tablet Take 500 mcg by mouth daily.     FARXIGA 10 MG TABS tablet Take 1 tablet (10 mg total) by mouth daily before breakfast. 30 tablet 5   fenofibrate 160 MG tablet Take 1 tablet (160 mg total) by mouth daily. 90 tablet 1   metoprolol succinate (TOPROL-XL) 25 MG 24 hr tablet Take 1 tablet (25 mg total) by mouth daily. Take with or immediately following a meal. 30 tablet 5   potassium chloride (KLOR-CON 10) 10 MEQ tablet Take 1 tablet (10 mEq total) by mouth daily. 90 tablet 1   spironolactone (ALDACTONE) 25 MG tablet Take 0.5 tablets  (12.5 mg total) by mouth daily. 45 tablet 3   sacubitril-valsartan (ENTRESTO) 24-26 MG Take 1 tablet by mouth 2 (two) times daily. 180 tablet 3   No current facility-administered medications for this visit.   BP (!) 101/51   Pulse 81   Wt 252 lb (114.3 kg)   LMP 11/03/2013   SpO2 98%   BMI 49.22 kg/m  General: NAD Neck: JVP 8 cm, no thyromegaly or thyroid nodule.  Lungs: Clear to auscultation bilaterally with normal respiratory effort. CV: Nondisplaced PMI.  Heart regular S1/S2, no S3/S4, no murmur.  Trace ankle edema.  No carotid bruit.  Normal pedal pulses.  Abdomen: Soft, nontender, no hepatosplenomegaly, no distention.  Skin: Intact without lesions or rashes.  Neurologic: Alert and oriented x 3.  Psych: Normal affect. Extremities: No clubbing or cyanosis.  HEENT: Normal.   1. Atrial fibrillation: Paroxysmal.  NSR today.  Atrial fibrillation is more likely a product of her cardiomyopathy than the cause as she seems to be in AF only rarely.  She denies palpitations. - Continue apixaban 5 mg bid.  2. Chronic systolic CHF: Echo in 9/24 with EF 20-25%, mild LV dilation, grade 2 diastolic dysfunction, low normal RV function, mild MR, IVC normal.  Cause of cardiomyopathy is uncertain.  No substance abuse history.  Main symptoms have been chronic cough and mild dyspnea x weeks to months, NYHA class II symptoms.  She appears minimally volume overloaded on exam.  - She will need RHC/LHC to assess filling pressures and cardiac output as well as to definitively rule out CAD as cause of her cardiomyopathy. She has RFs for CAD: family history, HTN, hyperlipidemia and she has atypical chest pain.  We discussed risks/benefits and she agrees to procedure. She will hold apixaban day before/day of.  - If she does not have significant coronary disease by cath, she will need cardiac MRI to look for infiltrative disease/prior myocarditis.  - Continue Farxiga 10 mg daily.  - Stop losartan, start Entresto  24/26 bid.  This will add a little extra diuresis.  BMET/BNP today, BMET in 10 days.  - Continue bumetanide 1 mg daily.  - Continue Toprol XL 25 mg daily - Continue spironolactone 12.5 mg daily.  3. Obesity: Unable to get coverage for GLP-1 agonist.  4. HTN: BP controlled.  5. OSA: Strongly suspected.  - She is going to do a home sleep study.   Followup in 3 wks with HF pharmacist.  See me in 6 wks.   Marca Ancona 07/27/2023

## 2023-07-27 NOTE — Patient Instructions (Signed)
DISCONTINUE Losartan  START Entresto 24/26mg  twice daily  Labs in 10 days  Follow up with pharmacy in 3 weeks  Follow up with Dr.McLean in 6 weeks  Do the following things EVERYDAY: Weigh yourself in the morning before breakfast. Write it down and keep it in a log. Take your medicines as prescribed Eat low salt foods--Limit salt (sodium) to 2000 mg per day.  Stay as active as you can everyday Limit all fluids for the day to less than 2 liters

## 2023-08-03 ENCOUNTER — Telehealth: Payer: Self-pay

## 2023-08-03 NOTE — Telephone Encounter (Signed)
  Pt called stating she has not heard from anyone about scheduling her cath. Pt needs orders placed and scheduling for R[facility] Heart Cath per [facility].

## 2023-08-04 ENCOUNTER — Other Ambulatory Visit: Payer: Self-pay | Admitting: *Deleted

## 2023-08-04 DIAGNOSIS — I5032 Chronic diastolic (congestive) heart failure: Secondary | ICD-10-CM

## 2023-08-04 NOTE — Telephone Encounter (Signed)
Spoke w/pt, advised still waiting on ins auth will go ahead and sch her for wk of 10/28 and call her back with exact date/time, advised if auth not approved before sch date will resch, she is agreeable.

## 2023-08-04 NOTE — Telephone Encounter (Signed)
R/L HC sch for Fri 11/1 at 9:30, all instructions reviewed via phone and sent via mychart. Pt aware, agreeable, and verbalized understanding. Mess sent to PA team as well.

## 2023-08-07 ENCOUNTER — Ambulatory Visit: Payer: PRIVATE HEALTH INSURANCE | Admitting: Dietician

## 2023-08-14 ENCOUNTER — Ambulatory Visit: Payer: PRIVATE HEALTH INSURANCE | Admitting: Cardiology

## 2023-08-17 ENCOUNTER — Other Ambulatory Visit (HOSPITAL_COMMUNITY): Payer: Self-pay

## 2023-08-17 NOTE — Progress Notes (Unsigned)
Advanced Heart Failure Clinic Note  PCP: Tammy Sinner, NP PCP-Cardiologist: None HF-Cardiologist: Tammy Ancona, MD  HPI:  62 y.o. with history of HTN and gout was referred by Tammy Chavez for evaluation of CHF and atrial fibrillation.  She was initially seen by [facility] for a chronic cough and mild dyspnea.  At her initial visit, she was in atrial fibrillation, but this resolved spontaneously, and she has had no obvious recurrence.  She has strong family history of AF.  Echo was done in 06/2023, this showed EF 20-25%, mild LV dilation, grade 2 diastolic dysfunction, low normal RV function, mild MR, IVC normal.  Patient does not drink or use drugs.  She has family history of CHF and CAD (brother with CABG).     Last seen by Tammy Chavez on 07/27/2023. She has a home sleep study but has not done it yet. Insurance would not cover GLP-1 agonist for her. At that time, losartan 50 mg daily was changed to Entresto 24-26 mg BID.  Today Tammy Chavez returns to Heart Failure Clinic for pharmacist medication titration. Reports feeling well. Reports some LEE but this is at or less than baseline. Denies dizziness lightheadedness chest pain palpitations SOB orthopnea orthostasis PND. Reports being able to complete all activities of daily living (ADLs). Is Not very active throughout the day. Weight at home is 247 pounds. Takes bumetanide 1 mg daily. Appetite is good. Does not follow a low sodium diet.  Current Heart Failure Medications: Loop diuretic: bumetanide 1 mg daily Beta-Blocker: metoprolol succinate 25 mg daily ACEI/ARB/ARNI: Entresto 24-26 mg BID MRA: spironolactone 12.5 mg daily SGLT2i: Farxiga 10 mg daily   Has the patient been experiencing any side effects to the medications prescribed? No  Does the patient have any problems obtaining medications due to transportation or finances? No  Understanding of regimen: Fair  Understanding of indications: Good  Potential of  adherence: Fair. Patient reports taking all prescribed medications. Called [facility] and verified fill history.  Patient understands to avoid NSAIDs.  Patient understands to avoid decongestants.  Pertinent Lab Values: Creat  Date Value Ref Range Status  03/24/2013 0.98 0.50 - 1.10 mg/dL Final   Creatinine, Ser  Date Value Ref Range Status  07/24/2023 1.12 (H) 0.57 - 1.00 mg/dL Final   BUN  Date Value Ref Range Status  07/24/2023 24 8 - 27 mg/dL Final   Potassium  Date Value Ref Range Status  07/24/2023 4.2 3.5 - 5.2 mmol/L Final   Sodium  Date Value Ref Range Status  07/24/2023 137 134 - 144 mmol/L Final   B Natriuretic Peptide  Date Value Ref Range Status  06/18/2023 165.8 (H) 0.0 - 100.0 pg/mL Final    Comment:    Performed at [facility], [address] [place], [place] [address]   Magnesium  Date Value Ref Range Status  06/18/2023 1.9 1.7 - 2.4 mg/dL Final    Comment:    Performed at [facility], [address] [place], [place] [address]   TSH  Date Value Ref Range Status  06/30/2023 3.880 0.450 - 4.500 uIU/mL Final    Vital Signs: Today's Vitals   08/18/23 1359  BP: 104/82  Pulse: 77  SpO2: 96%  Weight: 252 lb (114.3 kg)    Assessment/Plan: 1. Atrial fibrillation: Paroxysmal.  Atrial fibrillation is more likely a product of her cardiomyopathy than the cause as she seems to be in AF only rarely.  She denies palpitations. - Continue apixaban  5 mg bid.  2. Chronic systolic CHF: Echo in 06/2023 with EF 20-25%, mild LV dilation, grade 2 diastolic dysfunction, low normal RV function, mild MR, IVC normal.  Cause of cardiomyopathy is uncertain.  No substance abuse history.  Main symptoms have been chronic cough and mild dyspnea x weeks to months, NYHA class II symptoms.  She appears minimally volume overloaded on exam.  - RHC/LHC scheduled on 08/28/23 to assess filling pressures and cardiac output as well as to definitively rule out CAD as  cause of her cardiomyopathy. She has RFs for CAD: family history, HTN, hyperlipidemia and she has atypical chest pain.  She will hold apixaban day before/day of. Educated to hold [place] 3 days prior. - If she does not have significant coronary disease by cath, she will need cardiac MRI to look for infiltrative disease/prior myocarditis.  - Continue bumetanide 1 mg daily. No SOB today with mild LEE that is unchanged from previous. Last visit showed similar exam but normal REDS. Checking BNP today. - Continue Farxiga 10 mg daily.  - Continue Entresto 24/26 bid. Never got repeat BMET/BNP, will repeat today. - Increase Toprol XL to 50 mg daily. HR 77-80 today. - Continue spironolactone 12.5 mg daily. Can consider increasing pending today's lab results. 3. Obesity: Unable to get coverage for GLP-1 agonist because A1c is not >6.5 and she has no prior history of CVA/MI/PVD 4. HTN: BP controlled.  5. OSA: Strongly suspected.  - She is going to do a home sleep study.    Followup in 3 wks with Tammy Chavez  Please do not hesitate to reach out with questions or concerns,  Enos Fling, PharmD, CPP, BCPS Heart Failure Pharmacist  Phone - [phone] 08/18/2023 2:38 PM

## 2023-08-18 ENCOUNTER — Ambulatory Visit: Payer: PRIVATE HEALTH INSURANCE | Attending: Cardiology | Admitting: Pharmacist

## 2023-08-18 VITALS — BP 104/82 | HR 77 | Wt 252.0 lb

## 2023-08-18 DIAGNOSIS — I5032 Chronic diastolic (congestive) heart failure: Secondary | ICD-10-CM

## 2023-08-18 MED ORDER — METOPROLOL SUCCINATE ER 50 MG PO TB24: 50.0000 mg | ORAL_TABLET | Freq: Every day | ORAL | 3 refills | Status: DC

## 2023-08-18 NOTE — Patient Instructions (Signed)
It was a pleasure seeing you today!  MEDICATIONS: -We are changing your medications today -Increase metoprolol to 50 mg daily  -Call if you have questions about your medications.  LABS: -We will call you if your labs need attention.  NEXT APPOINTMENT: Return to clinic in 1 month with Dr. Shirlee Latch.  In general, to take care of your heart failure: -Limit your fluid intake to 2 Liters (half-gallon) per day.   -Limit your salt intake to ideally 2-3 grams (2000-3000 mg) per day. -Weigh yourself daily and record, and bring that "weight diary" to your next appointment.  (Weight gain of 2-3 pounds in 1 day typically means fluid weight.) -The medications for your heart are to help your heart and help you live longer.   -Please contact us before stopping any of your heart medications.  Call the clinic at [phone] with questions or to reschedule future appointments.

## 2023-08-19 LAB — BASIC METABOLIC PANEL
BUN/Creatinine Ratio: 21 (ref 12–28)
BUN: 26 mg/dL (ref 8–27)
CO2: 20 mmol/L (ref 20–29)
Calcium: 9.9 mg/dL (ref 8.7–10.3)
Chloride: 103 mmol/L (ref 96–106)
Creatinine, Ser: 1.23 mg/dL — ABNORMAL HIGH (ref 0.57–1.00)
Glucose: 112 mg/dL — ABNORMAL HIGH (ref 70–99)
Potassium: 4 mmol/L (ref 3.5–5.2)
Sodium: 143 mmol/L (ref 134–144)
eGFR: 50 mL/min/{1.73_m2} — ABNORMAL LOW (ref >59)

## 2023-08-19 LAB — BRAIN NATRIURETIC PEPTIDE: BNP: 45.2 pg/mL (ref 0.0–100.0)

## 2023-08-26 ENCOUNTER — Encounter
Admission: RE | Disposition: A | Discharge status: Home / Self Care | Payer: Self-pay | Source: Home / Self Care | Attending: Cardiology

## 2023-08-26 ENCOUNTER — Ambulatory Visit
Admission: RE | Admit: 2023-08-26 | Discharge: 2023-08-26 | Disposition: A | Discharge status: Home / Self Care | Payer: PRIVATE HEALTH INSURANCE | Attending: Cardiology | Admitting: Cardiology

## 2023-08-26 ENCOUNTER — Encounter: Payer: Self-pay | Admitting: Cardiology

## 2023-08-26 ENCOUNTER — Other Ambulatory Visit: Payer: Self-pay

## 2023-08-26 DIAGNOSIS — Z6841 Body Mass Index (BMI) 40.0 and over, adult: Secondary | ICD-10-CM | POA: Insufficient documentation

## 2023-08-26 DIAGNOSIS — M109 Gout, unspecified: Secondary | ICD-10-CM | POA: Insufficient documentation

## 2023-08-26 DIAGNOSIS — I480 Paroxysmal atrial fibrillation: Secondary | ICD-10-CM | POA: Insufficient documentation

## 2023-08-26 DIAGNOSIS — Z79899 Other long term (current) drug therapy: Secondary | ICD-10-CM | POA: Insufficient documentation

## 2023-08-26 DIAGNOSIS — E669 Obesity, unspecified: Secondary | ICD-10-CM | POA: Diagnosis not present

## 2023-08-26 DIAGNOSIS — Z8249 Family history of ischemic heart disease and other diseases of the circulatory system: Secondary | ICD-10-CM | POA: Insufficient documentation

## 2023-08-26 DIAGNOSIS — Z7901 Long term (current) use of anticoagulants: Secondary | ICD-10-CM | POA: Diagnosis not present

## 2023-08-26 DIAGNOSIS — I5022 Chronic systolic (congestive) heart failure: Secondary | ICD-10-CM | POA: Diagnosis not present

## 2023-08-26 DIAGNOSIS — I429 Cardiomyopathy, unspecified: Secondary | ICD-10-CM | POA: Diagnosis not present

## 2023-08-26 DIAGNOSIS — I509 Heart failure, unspecified: Secondary | ICD-10-CM

## 2023-08-26 DIAGNOSIS — I5032 Chronic diastolic (congestive) heart failure: Secondary | ICD-10-CM

## 2023-08-26 DIAGNOSIS — I2510 Atherosclerotic heart disease of native coronary artery without angina pectoris: Secondary | ICD-10-CM | POA: Insufficient documentation

## 2023-08-26 DIAGNOSIS — I110 Hypertensive heart disease with heart failure: Secondary | ICD-10-CM | POA: Insufficient documentation

## 2023-08-26 HISTORY — PX: RIGHT/LEFT HEART CATH AND CORONARY ANGIOGRAPHY: CATH118266

## 2023-08-26 LAB — POCT I-STAT EG7
Acid-Base Excess: 0 mmol/L (ref 0.0–2.0)
Bicarbonate: 25.8 mmol/L (ref 20.0–28.0)
Calcium, Ion: 1.27 mmol/L (ref 1.15–1.40)
HCT: 42 % (ref 36.0–46.0)
Hemoglobin: 14.3 g/dL (ref 12.0–15.0)
O2 Saturation: 65 %
Potassium: 3.9 mmol/L (ref 3.5–5.1)
Sodium: 140 mmol/L (ref 135–145)
TCO2: 27 mmol/L (ref 22–32)
pCO2, Ven: 45.3 mm[Hg] (ref 44–60)
pH, Ven: 7.363 (ref 7.25–7.43)
pO2, Ven: 35 mm[Hg] (ref 32–45)

## 2023-08-26 SURGERY — RIGHT/LEFT HEART CATH AND CORONARY ANGIOGRAPHY
Anesthesia: Moderate Sedation | Laterality: Bilateral

## 2023-08-26 MED ORDER — IOHEXOL 350 MG/ML SOLN: INTRAVENOUS | Status: DC | PRN

## 2023-08-26 MED ORDER — ACETAMINOPHEN 325 MG PO TABS: 650.0000 mg | ORAL_TABLET | ORAL | Status: DC | PRN

## 2023-08-26 MED ORDER — HYDRALAZINE HCL 20 MG/ML IJ SOLN: 10.0000 mg | INTRAMUSCULAR | Status: DC | PRN

## 2023-08-26 MED ORDER — HEPARIN SODIUM (PORCINE) 1000 UNIT/ML IJ SOLN
INTRAMUSCULAR | Status: AC
Start: 2023-08-26 — End: ?
  Filled 2023-08-26: qty 10

## 2023-08-26 MED ORDER — FENTANYL CITRATE (PF) 100 MCG/2ML IJ SOLN
INTRAMUSCULAR | Status: AC
  Filled 2023-08-26: qty 2

## 2023-08-26 MED ORDER — ASPIRIN 81 MG PO CHEW
81.0000 mg | CHEWABLE_TABLET | ORAL | Status: AC
  Administered 2023-08-26: 81 mg via ORAL

## 2023-08-26 MED ORDER — ONDANSETRON HCL 4 MG/2ML IJ SOLN: 4.0000 mg | Freq: Four times a day (QID) | INTRAMUSCULAR | Status: DC | PRN

## 2023-08-26 MED ORDER — HEPARIN (PORCINE) IN NACL 1000-0.9 UT/500ML-% IV SOLN
INTRAVENOUS | Status: AC
  Filled 2023-08-26: qty 1000

## 2023-08-26 MED ORDER — SODIUM CHLORIDE 0.9% FLUSH: 3.0000 mL | Freq: Two times a day (BID) | INTRAVENOUS | Status: DC

## 2023-08-26 MED ORDER — VERAPAMIL HCL 2.5 MG/ML IV SOLN
INTRAVENOUS | Status: DC | PRN
  Administered 2023-08-26: 2.5 mg via INTRAVENOUS

## 2023-08-26 MED ORDER — MIDAZOLAM HCL 2 MG/2ML IJ SOLN
INTRAMUSCULAR | Status: DC | PRN
  Administered 2023-08-26: 1 mg via INTRAVENOUS

## 2023-08-26 MED ORDER — SODIUM CHLORIDE 0.9% FLUSH
3.0000 mL | INTRAVENOUS | Status: DC | PRN
Start: 2023-08-26 — End: 2023-08-26

## 2023-08-26 MED ORDER — VERAPAMIL HCL 2.5 MG/ML IV SOLN
INTRAVENOUS | Status: AC
  Filled 2023-08-26: qty 2

## 2023-08-26 MED ORDER — SODIUM CHLORIDE 0.9 % IV SOLN: INTRAVENOUS | Status: DC

## 2023-08-26 MED ORDER — ASPIRIN 81 MG PO CHEW
CHEWABLE_TABLET | ORAL | Status: AC
  Filled 2023-08-26: qty 1

## 2023-08-26 MED ORDER — HEPARIN SODIUM (PORCINE) 1000 UNIT/ML IJ SOLN
INTRAMUSCULAR | Status: DC | PRN
  Administered 2023-08-26: 5500 [IU] via INTRAVENOUS

## 2023-08-26 MED ORDER — IOHEXOL 300 MG/ML  SOLN
INTRAMUSCULAR | Status: DC | PRN
  Administered 2023-08-26: 143 mL

## 2023-08-26 MED ORDER — APIXABAN 5 MG PO TABS: 5.0000 mg | ORAL_TABLET | Freq: Two times a day (BID) | ORAL | 6 refills | Status: DC

## 2023-08-26 MED ORDER — SODIUM CHLORIDE 0.9 % IV SOLN
250.0000 mL | INTRAVENOUS | Status: DC | PRN
Start: 2023-08-26 — End: 2023-08-26

## 2023-08-26 MED ORDER — FENTANYL CITRATE (PF) 100 MCG/2ML IJ SOLN
INTRAMUSCULAR | Status: DC | PRN
  Administered 2023-08-26: 25 ug via INTRAVENOUS

## 2023-08-26 MED ORDER — MIDAZOLAM HCL 2 MG/2ML IJ SOLN
INTRAMUSCULAR | Status: AC
  Filled 2023-08-26: qty 2

## 2023-08-26 MED ORDER — HEPARIN (PORCINE) IN NACL 2000-0.9 UNIT/L-% IV SOLN
INTRAVENOUS | Status: DC | PRN
  Administered 2023-08-26: 1000 mL

## 2023-08-26 MED ORDER — SODIUM CHLORIDE 0.9 % IV SOLN
INTRAVENOUS | Status: DC
Start: 2023-08-26 — End: 2023-08-26

## 2023-08-26 MED ORDER — LABETALOL HCL 5 MG/ML IV SOLN: 10.0000 mg | INTRAVENOUS | Status: DC | PRN

## 2023-08-26 SURGICAL SUPPLY — 19 items
CATH 5FR JL3.5 JR4 ANG PIG MP (CATHETERS) IMPLANT
CATH BALLN WEDGE 5F 110CM (CATHETERS) IMPLANT
CATH INFINITI AMBI 5FR JK (CATHETERS) IMPLANT
CATH LAUNCHER 5F EBU3.0 (CATHETERS) IMPLANT
CATHETER LAUNCHER 5F EBU3.0 (CATHETERS) ×1
DEVICE RAD TR BAND REGULAR (VASCULAR PRODUCTS) IMPLANT
DRAPE BRACHIAL (DRAPES) IMPLANT
GLIDESHEATH SLEND SS 6F .021 (SHEATH) IMPLANT
GUIDEWIRE .025 260CM (WIRE) IMPLANT
GUIDEWIRE INQWIRE 1.5J.035X260 (WIRE) IMPLANT
INQWIRE 1.5J .035X260CM (WIRE) ×1
NDL PERC 21GX4CM (NEEDLE) IMPLANT
NEEDLE PERC 21GX4CM (NEEDLE) ×1 IMPLANT
PACK CARDIAC CATH (CUSTOM PROCEDURE TRAY) IMPLANT
PROTECTION STATION PRESSURIZED (MISCELLANEOUS) ×1
SET ATX-X65L (MISCELLANEOUS) IMPLANT
SHEATH GLIDE SLENDER 4/5FR (SHEATH) IMPLANT
STATION PROTECTION PRESSURIZED (MISCELLANEOUS) IMPLANT
WIRE NITINOL .018 (WIRE) IMPLANT

## 2023-08-26 NOTE — Discharge Instructions (Addendum)
Radial Site Care Refer to this sheet in the next few weeks. These instructions provide you with information about caring for yourself after your procedure. Your health care provider may also give you more specific instructions. Your treatment has been planned according to current medical practices, but problems sometimes occur. Call your health care provider if you have any problems or questions after your procedure. What can I expect after the procedure? After your procedure, it is typical to have the following: Bruising at the radial site that usually fades within 1-2 weeks. Blood collecting in the tissue (hematoma) that may be painful to the touch. It should usually decrease in size and tenderness within 1-2 weeks.  Follow these instructions at home: Take medicines only as directed by your health care provider. If you are on a medication called Metformin please do not take for 48 hours after your procedure. Over the next 48hrs please increase your fluid intake of water and non caffeine beverages to flush the contrast dye out of your system.  You may shower 24 hours after the procedure  Leave your bandage on and gently wash the site with plain soap and water. Pat the area dry with a clean towel. Do not rub the site, because this may cause bleeding.  Remove your dressing 48hrs after your procedure and leave open to air.  Do not submerge your site in water for 7 days. This includes swimming and washing dishes.  Check your insertion site every day for redness, swelling, or drainage. Do not apply powder or lotion to the site. Do not flex or bend the affected arm for 24 hours or as directed by your health care provider. Do not push or pull heavy objects with the affected arm for 24 hours or as directed by your health care provider. Do not lift over 10 lb (4.5 kg) for 5 days after your procedure or as directed by your health care provider. Ask your health care provider when it is okay to: Return to  work or school. Resume usual physical activities or sports. Resume sexual activity. Do not drive home if you are discharged the same day as the procedure. Have someone else drive you. You may drive 48 hours after the procedure Do not operate machinery or power tools for 24 hours after the procedure. If your procedure was done as an outpatient procedure, which means that you went home the same day as your procedure, a responsible adult should be with you for the first 24 hours after you arrive home. Keep all follow-up visits as directed by your health care provider. This is important. Contact a health care provider if: You have a fever. You have chills. You have increased bleeding from the radial site. Hold pressure on the site. Get help right away if: You have unusual pain at the radial site. You have redness, warmth, or swelling at the radial site. You have drainage (other than a small amount of blood on the dressing) from the radial site. The radial site is bleeding, and the bleeding does not stop after 15 minutes of holding steady pressure on the site. Your arm or hand becomes pale, cool, tingly, or numb. This information is not intended to replace advice given to you by your health care provider. Make sure you discuss any questions you have with your health care provider. Document Released: 11/15/2010 Document Revised: 03/20/2016 Document Reviewed: 05/01/2014 Elsevier Interactive Patient Education  2018 Elsevier Inc.  

## 2023-08-26 NOTE — Interval H&P Note (Signed)
History and Physical Interval Note:  08/26/2023 8:15 AM  Tammy Chavez  has presented today for surgery, with the diagnosis of R and L Cath   HF.  The various methods of treatment have been discussed with the patient and family. After consideration of risks, benefits and other options for treatment, the patient has consented to  Procedure(s): RIGHT/LEFT HEART CATH AND CORONARY ANGIOGRAPHY (Bilateral) as a surgical intervention.  The patient's history has been reviewed, patient examined, no change in status, stable for surgery.  I have reviewed the patient's chart and labs.  Questions were answered to the patient's satisfaction.     [name] [employer]

## 2023-08-27 ENCOUNTER — Encounter: Payer: Self-pay | Admitting: Cardiology

## 2023-08-28 ENCOUNTER — Ambulatory Visit: Admit: 2023-08-28 | Payer: PRIVATE HEALTH INSURANCE | Admitting: Cardiology

## 2023-08-28 ENCOUNTER — Telehealth: Payer: Self-pay | Admitting: *Deleted

## 2023-08-28 DIAGNOSIS — I509 Heart failure, unspecified: Secondary | ICD-10-CM

## 2023-08-28 SURGERY — RIGHT/LEFT HEART CATH AND CORONARY ANGIOGRAPHY
Anesthesia: Moderate Sedation | Laterality: Bilateral

## 2023-08-28 NOTE — Telephone Encounter (Signed)
Pt called c/o itching all over since having her cath. Pt said she does not have a rash but thinks she is having a reaction from her heart cath. Pt has no other complaints at this time.   Routed to Dr.McLean

## 2023-08-29 NOTE — Telephone Encounter (Signed)
She could have been allergic to contrast. Is she still itching or has it resolved? Can use Benadryl 25 mg if not.

## 2023-08-31 NOTE — Telephone Encounter (Signed)
Reviewed Dr. Alford Highland advise with patient. Patient states she is still itching. States she has not yet tried Benadryl and agrees to try that.

## 2023-09-11 ENCOUNTER — Encounter: Payer: PRIVATE HEALTH INSURANCE | Attending: Cardiology | Admitting: Dietician

## 2023-09-11 ENCOUNTER — Encounter: Payer: Self-pay | Admitting: Dietician

## 2023-09-11 VITALS — Ht 60.0 in | Wt 255.8 lb

## 2023-09-11 DIAGNOSIS — Z6841 Body Mass Index (BMI) 40.0 and over, adult: Secondary | ICD-10-CM | POA: Insufficient documentation

## 2023-09-11 DIAGNOSIS — E782 Mixed hyperlipidemia: Secondary | ICD-10-CM | POA: Diagnosis not present

## 2023-09-11 DIAGNOSIS — Z713 Dietary counseling and surveillance: Secondary | ICD-10-CM | POA: Insufficient documentation

## 2023-09-11 DIAGNOSIS — I509 Heart failure, unspecified: Secondary | ICD-10-CM

## 2023-09-11 DIAGNOSIS — I480 Paroxysmal atrial fibrillation: Secondary | ICD-10-CM | POA: Diagnosis present

## 2023-09-11 NOTE — Patient Instructions (Addendum)
Look to buy a variety of salt-free seasonings (Mrs. Laddie Aquas, Herbs or dry spices) to keep at home and begin to prepare more meals at home. Prepare enough food to have leftovers for 2-3 days and be sure to reheat them thoroughly!  When purchasing canned/frozen foods, look for salt-free, low salt, or reduced sodium options. Drain and rinse your canned items to reduce sodium additionally.   Set a timer to get up and move around every hour and get away from your computer fro a few minutes.   REMEMBER, PRIORITIZE YOURSELF AND YOUR HEALTH!! WE NEED TO DO DIFFERENT THINGS TO ACHIEVE DIFFERENT OUTCOMES!! YOU GOT THIS!!

## 2023-09-11 NOTE — Progress Notes (Signed)
Medical Nutrition Therapy  Appointment Start time:  1030  Appointment End time:  1145  Primary concerns today: Heart health, Weight loss  Referral diagnosis: I48.0 - Paroxysmal A-fib, E66.01 - Obesity, E78.2 - Mixed HLD Preferred learning style: No preference indicated Learning readiness: Contemplating   NUTRITION ASSESSMENT   Anthropometrics: Ht: 60" Wt: 255.8 lbs BMI: 49.96 kg/m2  Clinical Medical Hx: CHF, Afib, HTN, HLD, Gout, Prediabetes, Anemia, Peripheral edema, Obesity Medications: Spironolactone, Entresto, Metoprolol, Fenofibrate, Farxiga, Bumetanide, Eliquis, Atorvastatin Labs: Creatinine - 1.23, eGFR - 50, A1c - 6.2%, Sodium - 145, TGL - 172 Notable Signs/Symptoms: N/A   Lifestyle & Dietary Hx Pt reports main concern is their heart function, would like to help cardiovascular function as well as lose some weight through diet and lifestyle change. Pt reports history of persistent cough, chest scan revealed <25% EF, pt states it has improved in the last couple of months. Pt reports looking at nutrition labels for sodium (<300 mg), states they cook meals for themselves maybe 1-2 times a week, otherwise packaged/processed foods. Pt reports they will not eat leftovers after about 1 day. Pt reports cutting down on their soda consumption, still drinks sweet tea at times, pt states they haven't found an artifical sweetener they like yet (Samples of Stevia given) Pt report working from, on computer, 6:00 am - 3:30 PM. Pt reports eating first meal ~9:00 am, then won't eat again until dinner, may snack on baked chips. Pt reports very little activity, may walk but gets hip pain if they walk for more than a short distance.   Estimated daily fluid intake: 64 oz Supplements: B12 Sleep: Occasional difficulties, Stress / self-care: Low stress, occasional work stress but minor Current average weekly physical activity: ADLs, walks occasionally   24-Hr Dietary Recall First Meal: [place]  sandwich on sweet bread w/ mayo, mustard, water Snack: none Second Meal: no lunch Snack: none Third Meal: Goulash (tomatoes, [place] seasoning, ground beef) Snack: none Beverages: water, die Dr Reino Kent    NUTRITION DIAGNOSIS  NB-1.1 Food and nutrition-related knowledge deficit As related to heart failure/obesity.  As evidenced by dietary composition high in packaged/processed foods, inactive lifestyle, EF >20%.   NUTRITION INTERVENTION  Nutrition education (E-1) on the following topics:  Educated patient on an appropriate blood pressure goal of <120/80. Educated patient on the relationship between dietary sodium intake and hypertension. Recommended between 1,500 and 2,000 mg of sodium per day. Educated patient on common sources of high sodium foods including packaged/processed foods, deli meats, fast foods, pickled food, sports drinks, and canned foods. Educated patient on the potential for kidney and heart complications related to uncontrolled hypertension. Educated patient on the combined effect of hypertension and elevated cholesterol on cardiovascular health. Educated patient on the positive impact of physical activity in lowering blood pressure and improving cardiovascular health.    Handouts Provided Include  Heart Failure Nutrition Therapy   Learning Style & Readiness for Change Teaching method utilized: Visual & Auditory  Demonstrated degree of understanding via: Teach Back  Barriers to learning/adherence to lifestyle change: Selflessness/disregard for oen health  Goals Established by Pt Look to buy a variety of salt-free seasonings (Mrs. Laddie Aquas, Herbs or dry spices) to keep at home and begin to prepare more meals at home. Prepare enough food to have leftovers for 2-3 days and be sure to reheat them thoroughly! When purchasing canned/frozen foods, look for salt-free, low salt, or reduced sodium options. Drain and rinse your canned items to reduce sodium additionally.  Set a timer to get up and move around every hour and get away from your computer fro a few minutes.  REMEMBER, PRIORITIZE YOURSELF AND YOUR HEALTH!! WE NEED TO DO DIFFERENT THINGS TO ACHIEVE DIFFERENT OUTCOMES!! YOU GOT THIS!!   MONITORING & EVALUATION Dietary intake, weekly physical activity, and sodium in 3 months.  Next Steps  Patient is to follow up with RD

## 2023-09-21 ENCOUNTER — Ambulatory Visit: Payer: PRIVATE HEALTH INSURANCE | Attending: Cardiology | Admitting: Cardiology

## 2023-09-21 VITALS — BP 117/52 | HR 67 | Wt 254.0 lb

## 2023-09-21 DIAGNOSIS — I480 Paroxysmal atrial fibrillation: Secondary | ICD-10-CM | POA: Insufficient documentation

## 2023-09-21 DIAGNOSIS — Z7901 Long term (current) use of anticoagulants: Secondary | ICD-10-CM | POA: Insufficient documentation

## 2023-09-21 DIAGNOSIS — Z79899 Other long term (current) drug therapy: Secondary | ICD-10-CM | POA: Insufficient documentation

## 2023-09-21 DIAGNOSIS — I5032 Chronic diastolic (congestive) heart failure: Secondary | ICD-10-CM

## 2023-09-21 DIAGNOSIS — I2510 Atherosclerotic heart disease of native coronary artery without angina pectoris: Secondary | ICD-10-CM | POA: Diagnosis not present

## 2023-09-21 DIAGNOSIS — I5022 Chronic systolic (congestive) heart failure: Secondary | ICD-10-CM | POA: Insufficient documentation

## 2023-09-21 DIAGNOSIS — Z7984 Long term (current) use of oral hypoglycemic drugs: Secondary | ICD-10-CM | POA: Insufficient documentation

## 2023-09-21 DIAGNOSIS — I110 Hypertensive heart disease with heart failure: Secondary | ICD-10-CM | POA: Diagnosis not present

## 2023-09-21 MED ORDER — SACUBITRIL-VALSARTAN 49-51 MG PO TABS: 1.0000 | ORAL_TABLET | Freq: Two times a day (BID) | ORAL | 11 refills | Status: DC

## 2023-09-21 MED ORDER — EZETIMIBE 10 MG PO TABS: 10.0000 mg | ORAL_TABLET | Freq: Every day | ORAL | 3 refills | Status: DC

## 2023-09-21 NOTE — Patient Instructions (Addendum)
Zetia 10 mg daily.  INCREASE your Entresto to 49/51 mg Twice daily  Go DOWN to LOWER LEVEL (LL) to have your blood work completed inside of [employer] office.  Go over to the MEDICAL MALL. Go pass the gift shop and have your blood work completed in 10 days  We will only call you if the results are abnormal or if the provider would like to make medication changes.  Your provider has ordered a MRI. YOU WILL BE CALLED TO HAVE THIS TEST ARRANGED ONCE YOUR INSURANCE COMPANY APPROVES IT.  You have been referred to Cardiac rehab.  They will call you to arrange your appointment.  Please follow up with our heart failure pharmacist as scheduled.  Your physician recommends that you schedule a follow-up appointment in: 2 months ( February 2025 ) ** PLEASE CALL THE OFFICE IN MID DECEMBER TO ARRANGE YOUR FOLLOW UP APPOINTMENT.

## 2023-09-21 NOTE — Progress Notes (Signed)
PCP: Tammy Sinner, NP Cardiology: Dr. Shirlee Latch  62 y.o. with history of HTN and gout was referred by Tammy Chavez for evaluation of CHF and atrial fibrillation.  She was initially seen by [facility] for a chronic cough and mild dyspnea.  At her initial visit, she was in atrial fibrillation, but this resolved spontaneously, and she has had no obvious recurrence.  She has strong family history of AF.  Echo was done in 9/24, this showed EF 20-25%, mild LV dilation, grade 2 diastolic dysfunction, low normal RV function, mild MR, IVC normal.  Patient does not drink or use drugs.  She has family history of CHF and CAD (brother with CABG).    LHC/RHC in 10/24 showed 60% ostial/proximal LAD stenosis, normal filling pressures, CI 2.14.   She returns for followup of cardiomyopathy of uncertain etiology.  Weight is up 2 lbs.  No chest pain.  Has home sleep study but has not used it yet.  No dyspnea walking on flat ground.  No orthopnea/PND.  No lightheadedness.  Has tolerated medication regimen well.   Labs (8/24): K 3.4, creatinine 0.89, LDL 103. Labs (9/24): K 4.2, creatinine 1.12, TSH normal, LDL 80, TGs 172  Labs (10/24): K 4, creatinine 1.23, BNP 45  ECG (personally reviewed): NSR, poor RWP  PMH: 1. HTN 2. Gout 3. Atrial fibrillation: Paroxysmal 4. Chronic systolic CHF: Nonischemic cardiomyopathy.  Echo (9/24) with EF 20-25%, mild LV dilation, grade 2 diastolic dysfunction, low normal RV function, mild MR, IVC normal.  - LHC/RHC (10/24): 60% ostial/proximal LAD stenosis; mean RA 1, PA 33/9, mean PCWP 7, CI 2.14.  5. Hyperlipidemia 6. CAD: 60% ostial/proximal LAD stenosis on cath 10/24.   FH: Mother with AF and CHF, brother with CABG, sisters with atrial fibrillation.   Social History   Socioeconomic History   Marital status: Single    Spouse name: Not on file   Number of children: Not on file   Years of education: Not on file   Highest education level: Not on file   Occupational History   Not on file  Tobacco Use   Smoking status: Never   Smokeless tobacco: Never  Vaping Use   Vaping status: Never Used  Substance and Sexual Activity   Alcohol use: No   Drug use: No   Sexual activity: Not on file  Other Topics Concern   Not on file  Social History Narrative   Lives with Tammy Chavez, sister.    Social Determinants of Health   Financial Resource Strain: Not on file  Food Insecurity: Not on file  Transportation Needs: Not on file  Physical Activity: Not on file  Stress: Not on file  Social Connections: Not on file  Intimate Partner Violence: Unknown (11/26/2022)   Received from [facility]   Intimate Partner Violence    Feels physically and emotionally safe: Not on file    Fear of partner: Not on file   ROS: All systems reviewed and negative except as per HPI.   Current Outpatient Medications  Medication Sig Dispense Refill   albuterol (VENTOLIN HFA) 108 (90 Base) MCG/ACT inhaler Inhale 2 puffs into the lungs every 6 (six) hours as needed for wheezing or shortness of breath. 8 g 2   allopurinol (ZYLOPRIM) 300 MG tablet TAKE ONE TABLET BY MOUTH ONCE DAILY 90 tablet 1   apixaban (ELIQUIS) 5 MG TABS tablet Take 1 tablet (5 mg total) by mouth 2 (two) times daily. 60 tablet 6  atorvastatin (LIPITOR) 80 MG tablet TAKE 1 BY MOUTH DAILY 90 tablet 1   bumetanide (BUMEX) 1 MG tablet Take 1 tablet (1 mg total) by mouth daily. 90 tablet 3   Cyanocobalamin 5000 MCG TBDP Take 5,000 mcg by mouth daily.     ezetimibe (ZETIA) 10 MG tablet Take 1 tablet (10 mg total) by mouth daily. 90 tablet 3   FARXIGA 10 MG TABS tablet Take 1 tablet (10 mg total) by mouth daily before breakfast. 30 tablet 5   fenofibrate 160 MG tablet Take 1 tablet (160 mg total) by mouth daily. 90 tablet 1   metoprolol succinate (TOPROL-XL) 50 MG 24 hr tablet Take 1 tablet (50 mg total) by mouth daily. Take with or immediately following a meal. 90 tablet 3    sacubitril-valsartan (ENTRESTO) 49-51 MG Take 1 tablet by mouth 2 (two) times daily. 60 tablet 11   spironolactone (ALDACTONE) 25 MG tablet Take 0.5 tablets (12.5 mg total) by mouth daily. 45 tablet 3   potassium chloride (KLOR-CON 10) 10 MEQ tablet Take 1 tablet (10 mEq total) by mouth daily. (Patient not taking: Reported on 08/17/2023) 90 tablet 1   No current facility-administered medications for this visit.   BP (!) 117/52   Pulse 67   Wt 254 lb (115.2 kg)   LMP 11/03/2013   SpO2 96%   BMI 49.61 kg/m  General: NAD, obese.  Neck: No JVD, no thyromegaly or thyroid nodule.  Lungs: Clear to auscultation bilaterally with normal respiratory effort. CV: Nondisplaced PMI.  Heart regular S1/S2, no S3/S4, no murmur.  No peripheral edema.  No carotid bruit.  Normal pedal pulses.  Abdomen: Soft, nontender, no hepatosplenomegaly, no distention.  Skin: Intact without lesions or rashes.  Neurologic: Alert and oriented x 3.  Psych: Normal affect. Extremities: No clubbing or cyanosis.  HEENT: Normal.   1. Atrial fibrillation: Paroxysmal.  NSR today.  Atrial fibrillation is more likely a product of her cardiomyopathy than the cause as she seems to be in AF only rarely.  She denies palpitations. - Continue apixaban 5 mg bid.  2. Chronic systolic CHF: Nonischemic cardiomyopathy.  Echo in 9/24 with EF 20-25%, mild LV dilation, grade 2 diastolic dysfunction, low normal RV function, mild MR, IVC normal.  No substance abuse history.  LHC/RHC in 10/24 showed 60% ostial/proximal LAD stenosis, normal filling pressures, CI 2.14. CAD does not explain cardiomyopathy.  NYHA class II symptoms.  Not volume overloaded on exam.   - I will arrange for cardiac MRI to assess infiltrative disease/myocarditis.  - Continue Farxiga 10 mg daily.  - Increase Entresto to 49/51 bid.  BMET/BNP today, BMET in 10 days.  - Continue bumetanide 1 mg daily.  - Continue Toprol XL 50 mg daily - Continue spironolactone 25 mg daily.  -  I will arrange for cardiac rehab.  3. Obesity: Unable to get coverage for GLP-1 agonist.  4. HTN: BP controlled.  5. OSA: Strongly suspected.  - She is going to do a home sleep study => has it at home, needs to use it.  6. CAD: LHC in 10/24 with 60% ostial/proximal LAD stenosis.  This does not explain cardiomyopathy.  No chest pain.  - Continue atorvastatin 80 mg daily and add Zetia 10 mg daily with goal LDL < 55.  Check lipids in 2 months.   Followup in 3 wks with HF pharmacist.  See me in 2 months.   Tammy Chavez 09/21/2023

## 2023-09-22 LAB — CBC
Hematocrit: 47.4 % — ABNORMAL HIGH (ref 34.0–46.6)
Hemoglobin: 15.2 g/dL (ref 11.1–15.9)
MCH: 28.4 pg (ref 26.6–33.0)
MCHC: 32.1 g/dL (ref 31.5–35.7)
MCV: 89 fL (ref 79–97)
Platelets: 431 10*3/uL (ref 150–450)
RBC: 5.35 x10E6/uL — ABNORMAL HIGH (ref 3.77–5.28)
RDW: 18.4 % — ABNORMAL HIGH (ref 11.7–15.4)
WBC: 8.4 10*3/uL (ref 3.4–10.8)

## 2023-09-22 LAB — BASIC METABOLIC PANEL
BUN/Creatinine Ratio: 26 (ref 12–28)
BUN: 29 mg/dL — ABNORMAL HIGH (ref 8–27)
CO2: 23 mmol/L (ref 20–29)
Calcium: 10.2 mg/dL (ref 8.7–10.3)
Chloride: 104 mmol/L (ref 96–106)
Creatinine, Ser: 1.11 mg/dL — ABNORMAL HIGH (ref 0.57–1.00)
Glucose: 79 mg/dL (ref 70–99)
Potassium: 4.4 mmol/L (ref 3.5–5.2)
Sodium: 144 mmol/L (ref 134–144)
eGFR: 56 mL/min/{1.73_m2} — ABNORMAL LOW (ref >59)

## 2023-09-22 LAB — BRAIN NATRIURETIC PEPTIDE: BNP: 40.6 pg/mL (ref 0.0–100.0)

## 2023-09-29 ENCOUNTER — Ambulatory Visit: Payer: PRIVATE HEALTH INSURANCE | Admitting: Nurse Practitioner

## 2023-09-30 ENCOUNTER — Encounter (HOSPITAL_COMMUNITY): Payer: Self-pay

## 2023-10-01 ENCOUNTER — Telehealth (HOSPITAL_COMMUNITY): Payer: Self-pay | Admitting: *Deleted

## 2023-10-01 NOTE — Telephone Encounter (Signed)
 Attempted to call patient regarding upcoming cardiac MRI appointment. Left message on voicemail with name and callback number Johney Frame RN Navigator Cardiac Imaging [facility] [phone] Office

## 2023-10-01 NOTE — Telephone Encounter (Signed)
[employer] auth# [id number]   Clinicals faxed to [phone]

## 2023-10-02 ENCOUNTER — Ambulatory Visit: Admission: RE | Admit: 2023-10-02 | Payer: PRIVATE HEALTH INSURANCE | Source: Ambulatory Visit

## 2023-10-02 ENCOUNTER — Other Ambulatory Visit: Payer: PRIVATE HEALTH INSURANCE

## 2023-10-05 NOTE — Progress Notes (Unsigned)
Advanced Heart Failure Clinic Note  PCP: [name], NP PCP-Cardiologist: None HF-Cardiologist: Marca Ancona, MD  HPI:  62 y.o. with history of HTN and gout was referred by [name] for evaluation of CHF and atrial fibrillation.  She was initially seen by [facility] for a chronic cough and mild dyspnea.  At her initial visit, she was in atrial fibrillation, but this resolved spontaneously, and she has had no obvious recurrence.  She has strong family history of AF.  Echo was done in 06/2023, this showed EF 20-25%, mild LV dilation, grade 2 diastolic dysfunction, low normal RV function, mild MR, IVC normal.  Patient does not drink or use drugs.  She has family history of CHF and CAD (brother with CABG).     Seen by Dr. Shirlee Latch on 07/27/2023. She has a home sleep study but has not done it yet. Insurance would not cover GLP-1 agonist for her. At that time, losartan 50 mg daily was changed to Entresto 24-26 mg BID.  Underwent RHC/LHC 07/2023 showing nonobstructive CAD with a cardiac index of 2.14.  Seen by Dr. Shirlee Latch 09/21/23 where Sherryll Burger was increased to 49-51 mg BID and ezetimibe was started. Also scheduled for upcoming cardiac MRI to evaluate for infiltrative disease.   Today Tammy Chavez returns to [facility] for pharmacist medication titration. Reports feeling well. Was upset that her CMRI was canceled. Denies LEE, dizziness, lightheadedness, chest pain, palpitations, SOB, orthopnea, orthostasis. PND. Reports being able to complete all activities of daily living (ADLs). Is Not very active throughout the day. Does not frequently weigh at home Takes bumetanide 1 mg daily. Appetite is good. Does not follow a low sodium diet.  Current Heart Failure Medications: Loop diuretic: bumetanide 1 mg daily Beta-Blocker: metoprolol succinate 50 mg daily ACEI/ARB/ARNI: Entresto 49-51 mg BID MRA: spironolactone 12.5 mg daily SGLT2i: Farxiga 10 mg daily   Has the  patient been experiencing any side effects to the medications prescribed? No  Does the patient have any problems obtaining medications due to transportation or finances? No  Understanding of regimen: Fair  Understanding of indications: Good  Potential of adherence: Fair. Patient reports taking all prescribed medications. Called [facility] and verified fill history.  Patient understands to avoid NSAIDs.  Patient understands to avoid decongestants.  Pertinent Lab Values: Creat  Date Value Ref Range Status  03/24/2013 0.98 0.50 - 1.10 mg/dL Final   Creatinine, Ser  Date Value Ref Range Status  09/21/2023 1.11 (H) 0.57 - 1.00 mg/dL Final   BUN  Date Value Ref Range Status  09/21/2023 29 (H) 8 - 27 mg/dL Final   Potassium  Date Value Ref Range Status  09/21/2023 4.4 3.5 - 5.2 mmol/L Final   Sodium  Date Value Ref Range Status  09/21/2023 144 134 - 144 mmol/L Final   B Natriuretic Peptide  Date Value Ref Range Status  06/18/2023 165.8 (H) 0.0 - 100.0 pg/mL Final    Comment:    Performed at [facility], [address] [place], [place] [address]   BNP  Date Value Ref Range Status  09/21/2023 40.6 0.0 - 100.0 pg/mL Final    Comment:    [employer] ADVIA Centaur XP methodology   Magnesium  Date Value Ref Range Status  06/18/2023 1.9 1.7 - 2.4 mg/dL Final    Comment:    Performed at [facility], [address], [place], [place] [address]   TSH  Date Value Ref Range Status  06/30/2023 3.880  0.450 - 4.500 uIU/mL Final    Vital Signs: Today's Vitals   10/06/23 1441  BP: 102/80  Pulse: 91  SpO2: 98%  Weight: 254 lb 3.2 oz (115.3 kg)     Assessment/Plan: 1. Atrial fibrillation: Paroxysmal.  NSR today.  Atrial fibrillation is more likely a product of her cardiomyopathy than the cause as she seems to be in AF only rarely.  She denies palpitations. - Continue apixaban 5 mg bid. Copay card provided today due to high copays. 2. Chronic systolic CHF:  Nonischemic cardiomyopathy.  Echo in 06/2023 with EF 20-25%, mild LV dilation, grade 2 diastolic dysfunction, low normal RV function, mild MR, IVC normal.  No substance abuse history.  LHC/RHC in 07/2023 showed 60% ostial/proximal LAD stenosis, normal filling pressures, CI 2.14. CAD does not explain cardiomyopathy.  NYHA class II symptoms.  Not volume overloaded on exam.   - Cardiac MRI was scheduled to assess infiltrative disease/myocarditis, however when the patient arrived, she was told it was canceled because she did not have an authorization. Authorization was confirmed today and she will be rescheduled. - Continue Farxiga 10 mg daily.  - Continue Entresto to 49/51 bid.  BMET today with recent increase. BP too soft today for further titration. - Continue bumetanide 1 mg daily.  - Increase Toprol XL to 75 mg daily for a week, then to 100 mg daily if tolerated.  - Continue spironolactone 12.5 mg daily. Intended to increase this today rather than metoprolol, however the patient reports she will be out of the state for the next month and unable to obtain labs in the next week. Can increase to target dose at next visit with Dr. Shirlee Latch.  3. Obesity: Unable to get coverage for GLP-1 agonist given she does not have CAD, PAD, or CVA history. 4. HTN: BP controlled.  5. OSA: Strongly suspected.  - She is going to do a home sleep study => has it at home, needs to use it.  6. CAD: LHC in 07/2023 with 60% ostial/proximal LAD stenosis.  This does not explain cardiomyopathy.  No chest pain.  - Continue atorvastatin 80 mg daily and add Zetia 10 mg daily with goal LDL < 55.  Check lipids in 1 month.     Followup with Dr. Shirlee Latch after she returns from her trip in mid-January.   Please do not hesitate to reach out with questions or concerns,  Tammy Chavez, PharmD, CPP, BCPS Heart Failure Pharmacist  Phone - [phone] 10/06/2023 3:20 PM

## 2023-10-06 ENCOUNTER — Other Ambulatory Visit
Admission: RE | Admit: 2023-10-06 | Discharge: 2023-10-06 | Disposition: A | Discharge status: Home / Self Care | Payer: PRIVATE HEALTH INSURANCE | Source: Ambulatory Visit | Attending: Cardiology | Admitting: Cardiology

## 2023-10-06 ENCOUNTER — Ambulatory Visit (HOSPITAL_BASED_OUTPATIENT_CLINIC_OR_DEPARTMENT_OTHER): Payer: PRIVATE HEALTH INSURANCE | Admitting: Pharmacist

## 2023-10-06 VITALS — BP 102/80 | HR 91 | Wt 254.2 lb

## 2023-10-06 DIAGNOSIS — I509 Heart failure, unspecified: Secondary | ICD-10-CM

## 2023-10-06 DIAGNOSIS — I5032 Chronic diastolic (congestive) heart failure: Secondary | ICD-10-CM | POA: Diagnosis present

## 2023-10-06 LAB — BASIC METABOLIC PANEL
Anion gap: 12 (ref 5–15)
BUN: 24 mg/dL — ABNORMAL HIGH (ref 8–23)
CO2: 28 mmol/L (ref 22–32)
Calcium: 9.4 mg/dL (ref 8.9–10.3)
Chloride: 101 mmol/L (ref 98–111)
Creatinine, Ser: 1.42 mg/dL — ABNORMAL HIGH (ref 0.44–1.00)
GFR, Estimated: 42 mL/min — ABNORMAL LOW (ref >60)
Glucose, Bld: 135 mg/dL — ABNORMAL HIGH (ref 70–99)
Potassium: 3.5 mmol/L (ref 3.5–5.1)
Sodium: 141 mmol/L (ref 135–145)

## 2023-10-06 MED ORDER — METOPROLOL SUCCINATE ER 100 MG PO TB24: 100.0000 mg | ORAL_TABLET | Freq: Every day | ORAL | 3 refills | Status: DC

## 2023-10-06 MED ORDER — SPIRONOLACTONE 25 MG PO TABS: 12.5000 mg | ORAL_TABLET | Freq: Every day | ORAL | 3 refills | Status: DC

## 2023-10-06 MED ORDER — SPIRONOLACTONE 25 MG PO TABS: 25.0000 mg | ORAL_TABLET | Freq: Every day | ORAL | 3 refills | Status: DC

## 2023-10-06 NOTE — Patient Instructions (Signed)
It was a pleasure seeing you today!  MEDICATIONS: -We are changing your medications today -Increase metoprolol succinate to 75 mg once daily for a week, then to 100 mg once daily if you tolerate it well -Call if you have questions about your medications.  LABS: -We will call you if your labs need attention.  NEXT APPOINTMENT: Return to clinic in January with Dr. Shirlee Latch.  In general, to take care of your heart failure: -Limit your fluid intake to 2 Liters (half-gallon) per day.   -Limit your salt intake to ideally 2-3 grams (2000-3000 mg) per day. -Weigh yourself daily and record, and bring that "weight diary" to your next appointment.  (Weight gain of 2-3 pounds in 1 day typically means fluid weight.) -The medications for your heart are to help your heart and help you live longer.   -Please contact us before stopping any of your heart medications.  Call the clinic at [phone] with questions or to reschedule future appointments.

## 2023-10-07 ENCOUNTER — Telehealth (HOSPITAL_COMMUNITY): Payer: Self-pay | Admitting: *Deleted

## 2023-10-07 ENCOUNTER — Telehealth: Payer: Self-pay

## 2023-10-07 DIAGNOSIS — I5032 Chronic diastolic (congestive) heart failure: Secondary | ICD-10-CM

## 2023-10-07 NOTE — Telephone Encounter (Signed)
CMRI auth   approval # [id number] expires 12/08/23

## 2023-10-07 NOTE — Telephone Encounter (Addendum)
-----   Message from [employer] sent at 10/06/2023  5:17 PM EST ----- Decrease bumetanide to 1 mg every other day, BMET in 10 days.   Pt aware and agreeable to decrease bumetanide to 1 mg every other day.  She states she is about to leave the state for the rest of the month and will be unable to come for lab work until the first week of January. She had no further questions or concerns.

## 2023-10-09 ENCOUNTER — Other Ambulatory Visit: Payer: Self-pay | Admitting: Cardiology

## 2023-10-09 ENCOUNTER — Ambulatory Visit
Admission: RE | Admit: 2023-10-09 | Discharge: 2023-10-09 | Disposition: A | Discharge status: Home / Self Care | Payer: PRIVATE HEALTH INSURANCE | Source: Ambulatory Visit | Attending: Cardiology | Admitting: Cardiology

## 2023-10-09 DIAGNOSIS — I5032 Chronic diastolic (congestive) heart failure: Secondary | ICD-10-CM | POA: Insufficient documentation

## 2023-10-09 MED ORDER — GADOBUTROL 1 MMOL/ML IV SOLN
14.0000 mL | Freq: Once | INTRAVENOUS | Status: AC | PRN
  Administered 2023-10-09: 14 mL via INTRAVENOUS

## 2023-12-03 ENCOUNTER — Other Ambulatory Visit: Payer: Self-pay

## 2023-12-03 DIAGNOSIS — E782 Mixed hyperlipidemia: Secondary | ICD-10-CM

## 2023-12-03 MED ORDER — FENOFIBRATE 160 MG PO TABS: 160.0000 mg | ORAL_TABLET | Freq: Every day | ORAL | 0 refills | Status: DC

## 2023-12-03 MED ORDER — ATORVASTATIN CALCIUM 80 MG PO TABS: 80.0000 mg | ORAL_TABLET | Freq: Every day | ORAL | 0 refills | Status: DC

## 2023-12-04 ENCOUNTER — Ambulatory Visit: Payer: PRIVATE HEALTH INSURANCE | Admitting: Dietician

## 2023-12-09 ENCOUNTER — Ambulatory Visit: Payer: Self-pay | Admitting: Nurse Practitioner

## 2023-12-17 ENCOUNTER — Ambulatory Visit: Payer: PRIVATE HEALTH INSURANCE | Admitting: Nurse Practitioner

## 2024-02-03 ENCOUNTER — Ambulatory Visit: Payer: PRIVATE HEALTH INSURANCE | Admitting: Pediatrics

## 2024-02-08 ENCOUNTER — Other Ambulatory Visit: Payer: Self-pay | Admitting: Cardiology

## 2024-02-27 ENCOUNTER — Other Ambulatory Visit: Payer: Self-pay | Admitting: Nurse Practitioner

## 2024-02-27 DIAGNOSIS — M1A09X0 Idiopathic chronic gout, multiple sites, without tophus (tophi): Secondary | ICD-10-CM

## 2024-02-29 NOTE — Telephone Encounter (Signed)
 Appt made 5-13 with Sandra

## 2024-02-29 NOTE — Telephone Encounter (Signed)
 Needs to be see for further refills

## 2024-03-08 ENCOUNTER — Ambulatory Visit (INDEPENDENT_AMBULATORY_CARE_PROVIDER_SITE_OTHER): Payer: PRIVATE HEALTH INSURANCE | Admitting: Nurse Practitioner

## 2024-03-08 ENCOUNTER — Encounter: Payer: Self-pay | Admitting: Nurse Practitioner

## 2024-03-08 VITALS — BP 112/60 | HR 54 | Temp 98.1°F | Ht 60.0 in | Wt 252.0 lb

## 2024-03-08 DIAGNOSIS — M1A09X0 Idiopathic chronic gout, multiple sites, without tophus (tophi): Secondary | ICD-10-CM | POA: Diagnosis not present

## 2024-03-08 DIAGNOSIS — I4891 Unspecified atrial fibrillation: Secondary | ICD-10-CM | POA: Diagnosis not present

## 2024-03-08 DIAGNOSIS — I10 Essential (primary) hypertension: Secondary | ICD-10-CM

## 2024-03-08 DIAGNOSIS — E782 Mixed hyperlipidemia: Secondary | ICD-10-CM

## 2024-03-08 DIAGNOSIS — Z1231 Encounter for screening mammogram for malignant neoplasm of breast: Secondary | ICD-10-CM

## 2024-03-08 MED ORDER — METOPROLOL SUCCINATE ER 100 MG PO TB24: 100.0000 mg | ORAL_TABLET | Freq: Every day | ORAL | 0 refills | Status: AC

## 2024-03-08 MED ORDER — FENOFIBRATE 160 MG PO TABS: 160.0000 mg | ORAL_TABLET | Freq: Every day | ORAL | 0 refills | Status: DC

## 2024-03-08 MED ORDER — ATORVASTATIN CALCIUM 80 MG PO TABS: 80.0000 mg | ORAL_TABLET | Freq: Every day | ORAL | 0 refills | Status: DC

## 2024-03-08 MED ORDER — FARXIGA 10 MG PO TABS: 10.0000 mg | ORAL_TABLET | Freq: Every day | ORAL | 0 refills | Status: DC

## 2024-03-08 MED ORDER — SPIRONOLACTONE 25 MG PO TABS: 12.5000 mg | ORAL_TABLET | Freq: Every day | ORAL | 0 refills | Status: AC

## 2024-03-08 MED ORDER — ALLOPURINOL 300 MG PO TABS: 300.0000 mg | ORAL_TABLET | Freq: Every day | ORAL | 0 refills | Status: DC

## 2024-03-08 NOTE — Progress Notes (Signed)
 Established Patient Office Visit  Subjective  Patient ID: Tammy Chavez, female    DOB: 1961/04/24  Age: 63 y.o. MRN: [id number]  Chief Complaint  Patient presents with   Medication Refill    HPI QUINTANA PESHLAKAI 63 year old female present Mar 08, 2024 for medication refill chronic disease management. She had reported that she was recently diagnosed with A-fib "my Apple watch showed that I was in A-fib, right to ED was referred to cardiology was started on Farxiga  and Eliquis .  She reports that she lives an hour away and the commute it is hard for her she is trying to find a practice closer to home just looking to get a medication refill as she has a new patient appointment scheduled for June.  Hypertension, follow-up  BP Readings from Last 3 Encounters:  03/08/24 112/60  10/06/23 102/80  09/21/23 (!) 117/52   Wt Readings from Last 3 Encounters:  03/08/24 252 lb (114.3 kg)  10/06/23 254 lb 3.2 oz (115.3 kg)  09/21/23 254 lb (115.2 kg)     She was last seen for hypertension 3 months ago.  BP at that visit was 102/80. Management since that visit includes metoprolol  100 mg daily.  She reports excellent compliance with treatment. She is not having side effects.  She is following a  diet. She  exercising. She  smoke.  Use of agents associated with hypertension: none.   Outside blood pressures are not being monitored. Symptoms: No chest pain No chest pressure  No palpitations No syncope  No dyspnea No orthopnea  No paroxysmal nocturnal dyspnea No lower extremity edema   Pertinent labs Lab Results  Component Value Date   CHOL 149 06/30/2023   HDL 40 06/30/2023   LDLCALC 80 06/30/2023   TRIG 172 (H) 06/30/2023   CHOLHDL 3.7 06/30/2023   Lab Results  Component Value Date   NA 141 10/06/2023   K 3.5 10/06/2023   CREATININE 1.42 (H) 10/06/2023   GFRNONAA 42 (L) 10/06/2023   GLUCOSE 135 (H) 10/06/2023   TSH 3.880 06/30/2023     The 10-year ASCVD risk score  (Arnett DK, et al., 2019) is: 4.1%  Lipid/Cholesterol, Follow-up  Last lipid panel Other pertinent labs  Lab Results  Component Value Date   CHOL 149 06/30/2023   HDL 40 06/30/2023   LDLCALC 80 06/30/2023   TRIG 172 (H) 06/30/2023   CHOLHDL 3.7 06/30/2023   Lab Results  Component Value Date   ALT 18 06/30/2023   AST 26 06/30/2023   PLT 431 09/21/2023   TSH 3.880 06/30/2023     She was last seen for this 3 months ago.  Management since that visit includes Lipitor 80 mg daily and fenofibrate  160 mg daily.  She reports excellent compliance with treatment. She is not having side effects.   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 The patient reports no acute gout attacks since last clinic visit. Attacks occur primarily in the left ankle and foot. Patient reports her chronic pain is completed, her joint stiffness is improved and her joint swelling is completed. Limitation on activities include none. The patient is avoiding  high purine foods and reports consuming 0 alcoholic drinks per year.   Patient Active Problem List   Diagnosis Date Noted   Screening for colon cancer 06/30/2023   Screening mammogram for breast cancer 06/30/2023   Atrial fibrillation (HCC) 06/30/2023   Prediabetes 06/30/2023   Routine general medical examination at a health care facility 06/27/2023   Chronic cough 05/26/2023   Cardiomegaly 05/26/2023   Acute congestive heart failure (HCC) 05/26/2023   Pleural effusion on right 05/26/2023   Mixed hyperlipidemia 01/09/2023   Adult body mass index 50.0-59.9 (HCC) 01/09/2023   Hypokalemia 10/23/2014    Peripheral edema 10/23/2014   Obesity 10/23/2014   Uncontrolled hypertension 07/25/2011   Gout 07/25/2011   Hyperlipidemia 04/04/2010   Anemia 04/04/2010   Past Medical History:  Diagnosis Date   Gout    Heel spur    Left   Hyperlipidemia    Hypertension    Past Surgical History:  Procedure Laterality Date   ACHILLES TENDON REPAIR     RIGHT/LEFT HEART CATH AND CORONARY ANGIOGRAPHY Bilateral 08/26/2023   Procedure: RIGHT/LEFT HEART CATH AND CORONARY ANGIOGRAPHY;  Surgeon: Darlis Eisenmenger, MD;  Location: [facility] INVASIVE CV LAB;  Service: Cardiovascular;  Laterality: Bilateral;   Social History   Tobacco Use   Smoking status: Never   Smokeless tobacco: Never  Vaping Use   Vaping status: Never Used  Substance Use Topics   Alcohol use: No   Drug use: No   Social History   Socioeconomic History   Marital status: Single    Spouse name: Not on file   Number of children: Not on file   Years of education: Not on file   Highest education level: Associate degree: occupational, Scientist, product/process development, or vocational program  Occupational History   Not on file  Tobacco Use   Smoking status: Never   Smokeless tobacco: Never  Vaping Use   Vaping status: Never Used  Substance and Sexual Activity   Alcohol use: No   Drug use: No   Sexual activity: Not on file  Other Topics Concern   Not on file  Social History Narrative   Lives with Eveleen Hinds, sister.    Social Drivers of Health   Financial Resource Strain: Medium Risk (03/07/2024)   Overall Financial Resource Strain (CARDIA)    Difficulty of Paying Living Expenses: Somewhat hard  Food Insecurity: Food Insecurity Present (03/07/2024)   Hunger Vital Sign    Worried About Running Out of Food in the Last Year: Sometimes true    Ran Out of Food in the Last Year: Sometimes true  Transportation Needs: No Transportation Needs (03/07/2024)   PRAPARE - Administrator, Civil Service (Medical): No    Lack of Transportation  (Non-Medical): No  Physical Activity: Insufficiently Active (03/07/2024)   Exercise Vital Sign    Days of Exercise per Week: 2 days    Minutes of Exercise per Session: 30 min  Stress: No Stress Concern Present (03/07/2024)   [employer] of Occupational Health - Occupational Stress Questionnaire    Feeling of Stress : Only a little  Social Connections: Unknown (03/07/2024)   Social Connection and Isolation Panel [NHANES]    Frequency of Communication with Friends and Family: More than three times a week    Frequency of Social Gatherings with Friends and Family: Once a week    Attends Religious Services: Patient declined    Database administrator or Organizations: No    Attends Engineer, structural: Not  on file    Marital Status: Never married  Intimate Partner Violence: Unknown (11/26/2022)   Received from [facility]   Intimate Partner Violence    Feels physically and emotionally safe: Not on file    Fear of partner: Not on file   Family Status  Relation Name Status   Mother  Deceased   Father  Deceased   Sister  Alive   Brother  Alive   Brother  Alive   Brother  Alive   Sister  Alive   Sister  Alive   Sister  Alive   Sister  Alive   Sister  Alive  No partnership data on file   Family History  Problem Relation Age of Onset   Heart disease Mother    Diabetes Sister    Hyperlipidemia Brother    Hyperlipidemia Brother    Hyperlipidemia Brother    Diabetes Sister    Allergies  Allergen Reactions   Lisinopril     Cough      Review of Systems  Constitutional:  Negative for chills and fever.  HENT:  Negative for congestion and sore throat.   Respiratory:  Negative for cough, shortness of breath and wheezing.   Cardiovascular:  Negative for chest pain and leg swelling.  Gastrointestinal:  Negative for constipation, diarrhea, nausea and vomiting.  Skin:  Negative for itching and rash.       Lipoma left thigh  Neurological:  Negative for  dizziness, focal weakness and headaches.  Psychiatric/Behavioral:  Negative for suicidal ideas. The patient does not have insomnia.    Negative unless indicated in HPI   Objective:     BP 112/60   Pulse (!) 54   Temp 98.1 F (36.7 C) (Temporal)   Ht 5' (1.524 m)   Wt 252 lb (114.3 kg)   LMP 11/03/2013   SpO2 94%   BMI 49.22 kg/m  BP Readings from Last 3 Encounters:  03/08/24 112/60  10/06/23 102/80  09/21/23 (!) 117/52   Wt Readings from Last 3 Encounters:  03/08/24 252 lb (114.3 kg)  10/06/23 254 lb 3.2 oz (115.3 kg)  09/21/23 254 lb (115.2 kg)      Physical Exam Vitals and nursing note reviewed.  Constitutional:      General: She is not in acute distress.    Appearance: She is morbidly obese.  HENT:     Head: Normocephalic and atraumatic.     Nose: Nose normal.     Mouth/Throat:     Mouth: Mucous membranes are moist.  Eyes:     Extraocular Movements: Extraocular movements intact.     Conjunctiva/sclera: Conjunctivae normal.     Pupils: Pupils are equal, round, and reactive to light.  Cardiovascular:     Heart sounds: Normal heart sounds.  Pulmonary:     Effort: Pulmonary effort is normal.     Breath sounds: Normal breath sounds.  Musculoskeletal:        General: Normal range of motion.     Right lower leg: No edema.     Left lower leg: No edema.  Skin:    General: Skin is warm and dry.     Findings: No rash.  Neurological:     Mental Status: She is alert and oriented to person, place, and time.  Psychiatric:        Mood and Affect: Mood normal.        Behavior: Behavior normal.        Thought Content: Thought  content normal.        Judgment: Judgment normal.      No results found for any visits on 03/08/24.  Last CBC Lab Results  Component Value Date   WBC 8.4 09/21/2023   HGB 15.2 09/21/2023   HCT 47.4 (H) 09/21/2023   MCV 89 09/21/2023   MCH 28.4 09/21/2023   RDW 18.4 (H) 09/21/2023   PLT 431 09/21/2023   Last metabolic panel Lab  Results  Component Value Date   GLUCOSE 135 (H) 10/06/2023   NA 141 10/06/2023   K 3.5 10/06/2023   CL 101 10/06/2023   CO2 28 10/06/2023   BUN 24 (H) 10/06/2023   CREATININE 1.42 (H) 10/06/2023   GFRNONAA 42 (L) 10/06/2023   CALCIUM  9.4 10/06/2023   PROT 7.1 06/30/2023   ALBUMIN 4.3 06/30/2023   LABGLOB 2.8 06/30/2023   AGRATIO 1.5 02/16/2018   BILITOT 0.3 06/30/2023   ALKPHOS 73 06/30/2023   AST 26 06/30/2023   ALT 18 06/30/2023   ANIONGAP 12 10/06/2023   Last lipids Lab Results  Component Value Date   CHOL 149 06/30/2023   HDL 40 06/30/2023   LDLCALC 80 06/30/2023   TRIG 172 (H) 06/30/2023   CHOLHDL 3.7 06/30/2023   Last hemoglobin A1c Lab Results  Component Value Date   HGBA1C 6.2 (H) 06/30/2023   Last thyroid  functions Lab Results  Component Value Date   TSH 3.880 06/30/2023   T4TOTAL 9.8 06/30/2023        Assessment & Plan:  Uncontrolled hypertension -     Spironolactone ; Take 0.5 tablets (12.5 mg total) by mouth daily.  Dispense: 90 tablet; Refill: 0 -     Metoprolol  Succinate ER; Take 1 tablet (100 mg total) by mouth daily. Take with or immediately following a meal.  Dispense: 90 tablet; Refill: 0  Atrial fibrillation, unspecified type (HCC) -     Farxiga ; Take 1 tablet (10 mg total) by mouth daily before breakfast. NEEDS FOLLOW UP APPOINTMENT FOR MORE REFILLS  Dispense: 30 tablet; Refill: 0  Idiopathic chronic gout of multiple sites without tophus -     Allopurinol ; Take 1 tablet (300 mg total) by mouth daily.  Dispense: 30 tablet; Refill: 0  Mixed hyperlipidemia -     Fenofibrate ; Take 1 tablet (160 mg total) by mouth daily.  Dispense: 90 tablet; Refill: 0 -     Atorvastatin  Calcium ; Take 1 tablet (80 mg total) by mouth daily.  Dispense: 90 tablet; Refill: 0  Screening mammogram for breast cancer -     3D Screening Mammogram, Left and Right; Future     Bartholomew Light is a 63 year old Caucasian female seen today for chronic disease management, no  acute distress  Hypertension: continue metoprolol  100 mg daily refill provided  A-fib: Continue Farxiga  and Eliquis , Farxiga  refilled  Hyperlipidemia: Continue fenofibrate  160 mg daily and Lipitor 80 mg daily both refilled  Gout: Continue allopurinol  300 mg daily refill provided  All the medications refilled she is instructed to contact the clinic with any concern Health maintenance: Declined Cervical screening, mammogram ordered client to schedule with the bus at checkout Encourage healthy lifestyle choices, including diet (rich in fruits, vegetables, and lean proteins, and low in salt and simple carbohydrates) and exercise (at least 30 minutes of moderate physical activity daily).     The above assessment and management plan was discussed with the patient. The patient verbalized understanding of and has agreed to the management plan. Patient is aware to call the clinic  if they develop any new symptoms or if symptoms persist or worsen. Patient is aware when to return to the clinic for a follow-up visit. Patient educated on when it is appropriate to go to the emergency department.  Return in about 3 months (around 06/08/2024) for chronic Diseases Management.    Tabor Denham [place] Thompson, DNP [facility] [address] [place], [place] [address] [phone]    Note: This document was prepared by [employer] voice dictation technology and any errors that results from this process are unintentional.

## 2024-03-23 ENCOUNTER — Inpatient Hospital Stay: Admission: RE | Admit: 2024-03-23 | Payer: PRIVATE HEALTH INSURANCE | Source: Ambulatory Visit

## 2024-03-30 ENCOUNTER — Encounter: Payer: Self-pay | Admitting: Pediatrics

## 2024-03-30 ENCOUNTER — Ambulatory Visit: Payer: PRIVATE HEALTH INSURANCE | Admitting: Pediatrics

## 2024-03-30 VITALS — BP 107/71 | HR 56 | Temp 97.5°F | Ht 60.0 in | Wt 252.3 lb

## 2024-03-30 DIAGNOSIS — I4891 Unspecified atrial fibrillation: Secondary | ICD-10-CM | POA: Diagnosis not present

## 2024-03-30 DIAGNOSIS — Z7689 Persons encountering health services in other specified circumstances: Secondary | ICD-10-CM

## 2024-03-30 DIAGNOSIS — I10 Essential (primary) hypertension: Secondary | ICD-10-CM

## 2024-03-30 DIAGNOSIS — Z1231 Encounter for screening mammogram for malignant neoplasm of breast: Secondary | ICD-10-CM

## 2024-03-30 DIAGNOSIS — N939 Abnormal uterine and vaginal bleeding, unspecified: Secondary | ICD-10-CM

## 2024-03-30 DIAGNOSIS — M1A09X0 Idiopathic chronic gout, multiple sites, without tophus (tophi): Secondary | ICD-10-CM | POA: Diagnosis not present

## 2024-03-30 DIAGNOSIS — L600 Ingrowing nail: Secondary | ICD-10-CM

## 2024-03-30 MED ORDER — FARXIGA 10 MG PO TABS: 10.0000 mg | ORAL_TABLET | Freq: Every day | ORAL | 3 refills | Status: AC

## 2024-03-30 MED ORDER — ALLOPURINOL 300 MG PO TABS: 300.0000 mg | ORAL_TABLET | Freq: Every day | ORAL | 3 refills | Status: AC

## 2024-03-30 NOTE — Progress Notes (Signed)
 Establish Care Note  BP 107/71   Pulse (!) 56   Temp (!) 97.5 F (36.4 C) (Oral)   Ht 5' (1.524 m)   Wt 252 lb 4.8 oz (114.4 kg)   LMP 11/03/2013   SpO2 97%   BMI 49.27 kg/m    Subjective:    Patient ID: Tammy Chavez, female    DOB: 1961/01/19, 63 y.o.   MRN: [id number]  HPI: Tammy Chavez is a 63 y.o. female  Chief Complaint  Patient presents with   Establish Care    Has been bleeding for the past 2 weeks not sexually active bleeding has stopped. Ingrown toe nails    Establishing care, the following was discussed today:  Discussed the use of AI scribe software for clinical note transcription with the patient, who gave verbal consent to proceed.  History of Present Illness   Tammy Chavez is a 63 year old female who presents with postmenopausal bleeding and pelvic pain.  She experienced postmenopausal bleeding for the past two weeks, which has recently stopped. The bleeding was light, akin to a light period, with some small clots. She has not had a menstrual period since her late thirties or early forties. During the bleeding episode, she experienced pain that radiated to the right side, raising concern for appendicitis, but she did not seek emergency care. She has been using naproxen , which alleviates the pain.  She has a history of atrial fibrillation, initially detected by an Apple Watch, but has not experienced any symptoms since the initial episode. She is currently taking Farxiga , Entresto , and Eliquis . She mentions the high cost of these medications, particularly Farxiga , which costs her $700.  She reports throbbing pain in her right big toe, particularly at night, and suspects it might be an ingrown toenail. The pain is located on the left side of the right big toe. Her feet are naturally dry, and she has not had any procedures to address the toenail issue.  She lives with her sister and has inherited a Yorkie from her deceased older sister. 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 She is up to date on her tetanus vaccine, last received in 2017.      Current Outpatient Medications on File Prior to Visit  Medication Sig Dispense Refill   apixaban  (ELIQUIS ) 5 MG TABS tablet Take 1 tablet (5 mg total) by mouth 2 (two) times daily. 60 tablet 6   atorvastatin  (LIPITOR) 80 MG tablet Take 1 tablet (80 mg total) by mouth daily. 90 tablet 0   bumetanide  (BUMEX ) 1 MG tablet Take 1 tablet (1 mg total) by mouth daily. 90 tablet 3   Cyanocobalamin 5000 MCG TBDP Take 5,000 mcg by mouth daily.     fenofibrate  160 MG tablet Take 1 tablet (160 mg total) by mouth daily. 90 tablet 0   metoprolol  succinate (TOPROL -XL) 100 MG 24 hr tablet Take 1 tablet (100 mg total) by mouth daily. Take with or immediately following a meal. 90 tablet 0   sacubitril -valsartan  (ENTRESTO ) 49-51 MG Take 1 tablet by mouth 2 (two) times daily. 60 tablet 11   spironolactone  (ALDACTONE ) 25 MG tablet Take 0.5 tablets (12.5 mg total) by mouth daily. 90 tablet 0   albuterol  (VENTOLIN  HFA) 108 (90 Base) MCG/ACT inhaler Inhale 2 puffs into the lungs every 6 (six) hours as needed for wheezing or shortness of breath. (Patient not taking: Reported on 10/06/2023) 8 g 2  No current facility-administered medications on file prior to visit.    #HM Will review HM records and updated as needed.  Relevant past medical, surgical, family and social history reviewed and updated as indicated. Interim medical history since our last visit reviewed. Allergies and medications reviewed and updated.  ROS per HPI unless specifically indicated above     Objective:     BP 107/71   Pulse (!) 56   Temp (!) 97.5 F (36.4 C) (Oral)   Ht 5' (1.524 m)   Wt 252 lb 4.8 oz (114.4 kg)   LMP 11/03/2013   SpO2 97%   BMI 49.27 kg/m   Wt Readings from Last 3 Encounters:  03/30/24 252 lb 4.8 oz (114.4 kg)  03/08/24 252 lb (114.3 kg)  10/06/23 254 lb 3.2 oz (115.3 kg)      Physical Exam Constitutional:      Appearance: Normal appearance.  HENT:     Head: Normocephalic and atraumatic.  Eyes:     Pupils: Pupils are equal, round, and reactive to light.  Cardiovascular:     Rate and Rhythm: Normal rate and regular rhythm.     Pulses: Normal pulses.     Heart sounds: Normal heart sounds.  Pulmonary:     Effort: Pulmonary effort is normal.     Breath sounds: Normal breath sounds.  Abdominal:     General: Abdomen is flat.     Palpations: Abdomen is soft.  Musculoskeletal:        General: Normal range of motion.     Cervical back: Normal range of motion.  Skin:    General: Skin is warm and dry.     Capillary Refill: Capillary refill takes less than 2 seconds.  Neurological:     General: No focal deficit present.     Mental Status: She is alert. Mental status is at baseline.  Psychiatric:        Mood and Affect: Mood normal.        Behavior: Behavior normal.         03/30/2024    3:40 PM 09/11/2023   10:37 AM 06/30/2023   12:20 PM 04/20/2018    4:25 PM 02/16/2018    8:06 AM  Depression screen PHQ 2/9  Decreased Interest 0 0 0 0 0  Down, Depressed, Hopeless 0 0 0 0 0  PHQ - 2 Score 0 0 0 0 0  Altered sleeping 0  0    Tired, decreased energy 0  0    Change in appetite 0  0    Feeling bad or failure about yourself  0  0    Trouble concentrating 0  0    Moving slowly or fidgety/restless 0  0    Suicidal thoughts 0  0    PHQ-9 Score 0  0    Difficult doing work/chores Not difficult at all  Not difficult at all          03/30/2024    3:41 PM 06/30/2023   12:20 PM  GAD 7 : Generalized Anxiety Score  Nervous, Anxious, on Edge 0 0  Control/stop worrying 0 0  Worry too much - different things 0 0  Trouble relaxing 0 0  Restless 0 0  Easily annoyed or irritable 0 0  Afraid - awful might happen 0 0  Total GAD 7 Score 0 0  Anxiety Difficulty Not difficult at all Not difficult at all       Assessment & Plan:  Assessment & Plan   Abnormal  uterine bleeding (AUB) Assessment & Plan: Postmenopausal bleeding resolved. Gynecological evaluation needed to determine cause. - Refer to gynecologist for evaluation. - Continue routine care if evaluation is normal.  Orders: -     Ambulatory referral to Gynecology  Primary hypertension Assessment & Plan: Well controlled on entresto  49-51, metop succinate 100mg  XL, spironolactone  25mg , bumex  1mg  daily.  Orders: -     Basic metabolic panel with GFR  Idiopathic chronic gout of multiple sites without tophus Assessment & Plan: On allopurinol , prefers 90-day supply for convenience. No recent flares. - Prescribe 90-day supply of allopurinol .  Orders: -     Allopurinol ; Take 1 tablet (300 mg total) by mouth daily.  Dispense: 90 tablet; Refill: 3  Atrial fibrillation, unspecified type [facility]) Assessment & Plan: Asymptomatic, on Farxiga , Entresto , and Eliquis . Concern about medication costs. - Consult with clinical pharmacist for cost-reduction options.  Orders: -     Farxiga ; Take 1 tablet (10 mg total) by mouth daily before breakfast. NEEDS FOLLOW UP APPOINTMENT FOR MORE REFILLS  Dispense: 90 tablet; Refill: 3  Encounter for screening mammogram for malignant neoplasm of breast -     3D Screening Mammogram, Left and Right; Future  Ingrown toenail Significant pain in big toes, prefers to address postmenopausal bleeding first. - Refer to podiatrist after postmenopausal bleeding is addressed.  Encounter to establish care Reviewed available patient record including history, medications, problem list. HM updated as able. Will review and/or request outside records (if applicable) and will fill remaining HM gaps as needed at follow up visit.   Follow up plan: Return in about 3 months (around 06/30/2024) for Chronic illness f/u.  Hadassah Letters, MD

## 2024-03-30 NOTE — Patient Instructions (Addendum)
 You have an order for:  []   2D Mammogram  [x]   3D Mammogram  []   Bone Density     Please call for appointment:  [facility]  [address] [place] [address] [phone] [facility] [address] [place], [place] [address] [phone] [facility] [address] Geanie [place], [place] [address] [phone]   Make sure to wear two-piece clothing.  No lotions, powders, or deodorants the day of the appointment. Make sure to bring picture ID and insurance card.  Bring list of medications you are currently taking including any supplements.   Schedule your [place] screening mammogram through MyChart!   Log into your MyChart account.  Go to 'Visit' (or 'Appointments' if on mobile App) --> Schedule an Appointment  Under 'Select a Reason for Visit' choose the Mammogram Screening option.  Complete the pre-visit questions and select the time and place that best fits your schedule.   Good to meet you! Welcome to [facility]!  As your primary care doctor, I look forward to working with you to help you reach your health goals.  Please be aware of a couple of logistical items: - If you message me on mychart, it may take me 1-2 business days to get back to you. This is for non-urgent messaging.  - If you require urgent clinical attention, please call the clinic or present to urgent care/emergency room - If you have labs, I typically will send a message about them in 1-2 business days. - I am not here on Mondays, otherwise will be available from Tuesday-Friday during 8a-5pm.

## 2024-03-31 LAB — BASIC METABOLIC PANEL WITH GFR
BUN/Creatinine Ratio: 24 (ref 12–28)
BUN: 29 mg/dL — ABNORMAL HIGH (ref 8–27)
CO2: 22 mmol/L (ref 20–29)
Calcium: 9.9 mg/dL (ref 8.7–10.3)
Chloride: 101 mmol/L (ref 96–106)
Creatinine, Ser: 1.2 mg/dL — ABNORMAL HIGH (ref 0.57–1.00)
Glucose: 86 mg/dL (ref 70–99)
Potassium: 4.1 mmol/L (ref 3.5–5.2)
Sodium: 143 mmol/L (ref 134–144)
eGFR: 51 mL/min/{1.73_m2} — ABNORMAL LOW (ref >59)

## 2024-04-04 ENCOUNTER — Ambulatory Visit: Payer: Self-pay | Admitting: Pediatrics

## 2024-04-04 ENCOUNTER — Encounter: Payer: Self-pay | Admitting: Pediatrics

## 2024-04-04 DIAGNOSIS — N939 Abnormal uterine and vaginal bleeding, unspecified: Secondary | ICD-10-CM | POA: Insufficient documentation

## 2024-04-04 NOTE — Addendum Note (Signed)
 Addended by: Hadassah Letters on: 04/04/2024 01:51 PM   Modules accepted: Orders

## 2024-04-04 NOTE — Assessment & Plan Note (Addendum)
 On allopurinol , prefers 90-day supply for convenience. No recent flares. - Prescribe 90-day supply of allopurinol .

## 2024-04-04 NOTE — Assessment & Plan Note (Signed)
 Asymptomatic, on Farxiga , Entresto , and Eliquis . Concern about medication costs. - Consult with clinical pharmacist for cost-reduction options.

## 2024-04-04 NOTE — Assessment & Plan Note (Signed)
 Well controlled on entresto  49-51, metop succinate 100mg  XL, spironolactone  25mg , bumex  1mg  daily.

## 2024-04-04 NOTE — Assessment & Plan Note (Signed)
 Postmenopausal bleeding resolved. Gynecological evaluation needed to determine cause. - Refer to gynecologist for evaluation. - Continue routine care if evaluation is normal.

## 2024-04-05 ENCOUNTER — Telehealth: Payer: Self-pay

## 2024-04-05 NOTE — Progress Notes (Signed)
 Care Guide Pharmacy Note  04/05/2024 Name: MALEEYA PETERKIN MRN: [id number] DOB: 20-Jul-1961  Referred By: Hadassah Letters, MD Reason for referral: Complex Care Management (Outreach to schedule with Pharm d )   WYNONA DUHAMEL is a 63 y.o. year old female who is a primary care patient of Hadassah Letters, MD.  Susie Erichsen was referred to the pharmacist for assistance related to: Atrial Fibrillation  Successful contact was made with the patient to discuss pharmacy services including being ready for the pharmacist to call at least 5 minutes before the scheduled appointment time and to have medication bottles and any blood pressure readings ready for review. The patient agreed to meet with the pharmacist via telephone visit on (date/time).04/15/2024  Lenton Rail , RMA     [place]  [facility] Guide  Direct Dial: [phone]  Website: [email or web address]

## 2024-04-06 ENCOUNTER — Emergency Department
Admission: EM | Admit: 2024-04-06 | Discharge: 2024-04-06 | Disposition: A | Discharge status: Home / Self Care | Payer: PRIVATE HEALTH INSURANCE | Attending: Emergency Medicine | Admitting: Emergency Medicine

## 2024-04-06 ENCOUNTER — Emergency Department: Payer: PRIVATE HEALTH INSURANCE

## 2024-04-06 DIAGNOSIS — D259 Leiomyoma of uterus, unspecified: Secondary | ICD-10-CM | POA: Diagnosis not present

## 2024-04-06 DIAGNOSIS — Z7901 Long term (current) use of anticoagulants: Secondary | ICD-10-CM | POA: Insufficient documentation

## 2024-04-06 DIAGNOSIS — N939 Abnormal uterine and vaginal bleeding, unspecified: Secondary | ICD-10-CM | POA: Diagnosis present

## 2024-04-06 DIAGNOSIS — I10 Essential (primary) hypertension: Secondary | ICD-10-CM | POA: Insufficient documentation

## 2024-04-06 LAB — CBC
HCT: 41.8 % (ref 36.0–46.0)
Hemoglobin: 13.4 g/dL (ref 12.0–15.0)
MCH: 29.9 pg (ref 26.0–34.0)
MCHC: 32.1 g/dL (ref 30.0–36.0)
MCV: 93.3 fL (ref 80.0–100.0)
Platelets: 386 10*3/uL (ref 150–400)
RBC: 4.48 MIL/uL (ref 3.87–5.11)
RDW: 14.1 % (ref 11.5–15.5)
WBC: 7.9 10*3/uL (ref 4.0–10.5)
nRBC: 0 % (ref 0.0–0.2)

## 2024-04-06 LAB — URINALYSIS, ROUTINE W REFLEX MICROSCOPIC
Bacteria, UA: NONE SEEN
RBC / HPF: >50 RBC/hpf (ref 0–5)
Squamous Epithelial / HPF: 0 /HPF (ref 0–5)
WBC, UA: 0 WBC/hpf (ref 0–5)

## 2024-04-06 LAB — COMPREHENSIVE METABOLIC PANEL WITH GFR
ALT: 27 U/L (ref 0–44)
AST: 30 U/L (ref 15–41)
Albumin: 4 g/dL (ref 3.5–5.0)
Alkaline Phosphatase: 35 U/L — ABNORMAL LOW (ref 38–126)
Anion gap: 9 (ref 5–15)
BUN: 28 mg/dL — ABNORMAL HIGH (ref 8–23)
CO2: 26 mmol/L (ref 22–32)
Calcium: 9.4 mg/dL (ref 8.9–10.3)
Chloride: 104 mmol/L (ref 98–111)
Creatinine, Ser: 1.12 mg/dL — ABNORMAL HIGH (ref 0.44–1.00)
GFR, Estimated: 56 mL/min — ABNORMAL LOW (ref >60)
Glucose, Bld: 113 mg/dL — ABNORMAL HIGH (ref 70–99)
Potassium: 3.7 mmol/L (ref 3.5–5.1)
Sodium: 139 mmol/L (ref 135–145)
Total Bilirubin: 0.7 mg/dL (ref 0.0–1.2)
Total Protein: 7.6 g/dL (ref 6.5–8.1)

## 2024-04-06 LAB — TYPE AND SCREEN
ABO/RH(D): O POS
Antibody Screen: NEGATIVE

## 2024-04-06 NOTE — ED Triage Notes (Signed)
 Pt to ED via POV from home. Pt reports she has went through menopause and has been having scant bleeding for the past week that has now turned into heavy bleeding x1 day with blood clots. Pt reports today has went through 2 briefs. Pt is on eliquis .

## 2024-04-06 NOTE — Discharge Instructions (Signed)
 Please call and schedule follow-up appointment with gynecology.  If the bleeding continues to be heavy and you begin to have symptoms such as dizziness, lightheadedness, or feel that your heart rate has increased, please return to the emergency department.

## 2024-04-09 NOTE — ED Provider Notes (Signed)
 [facility] Provider Note    Event Date/Time   First MD Initiated Contact with Patient 04/06/24 1039     (approximate)   History   Vaginal Bleeding   HPI  Tammy Chavez is a 63 y.o. female with history of hypertension, a fib on Eliquis ,  and as listed in EMR presents to the emergency department for treatment and evaluation of vaginal bleeding. She reports natural menopause in her 43s and hasn't had any vaginal bleeding until about a week ago. It started as light, but has had heavier bleeding today and is passing clots. No plevic/abdominal pain. No fever or dysuria.      Physical Exam   Triage Vital Signs: ED Triage Vitals  Encounter Vitals Group     BP 04/06/24 1019 (!) 104/52     Girls Systolic BP Percentile --      Girls Diastolic BP Percentile --      Boys Systolic BP Percentile --      Boys Diastolic BP Percentile --      Pulse Rate 04/06/24 1019 65     Resp 04/06/24 1019 20     Temp 04/06/24 1019 97.8 F (36.6 C)     Temp Source 04/06/24 1019 Oral     SpO2 04/06/24 1019 96 %     Weight --      Height --      Head Circumference --      Peak Flow --      Pain Score 04/06/24 1101 3     Pain Loc --      Pain Education --      Exclude from Growth Chart --     Most recent vital signs: Vitals:   04/06/24 1019  BP: (!) 104/52  Pulse: 65  Resp: 20  Temp: 97.8 F (36.6 C)  SpO2: 96%    General: Awake, no distress.  CV:  Good peripheral perfusion.  Resp:  Normal effort.  Abd:  No distention.  Other:     ED Results / Procedures / Treatments   Labs (all labs ordered are listed, but only abnormal results are displayed) Labs Reviewed  COMPREHENSIVE METABOLIC PANEL WITH GFR - Abnormal; Notable for the following components:      Result Value   Glucose, Bld 113 (*)    BUN 28 (*)    Creatinine, Ser 1.12 (*)    Alkaline Phosphatase 35 (*)    GFR, Estimated 56 (*)    All other components within normal limits  URINALYSIS,  ROUTINE W REFLEX MICROSCOPIC - Abnormal; Notable for the following components:   Color, Urine RED (*)    APPearance TURBID (*)    Glucose, UA   (*)    Value: TEST NOT REPORTED DUE TO COLOR INTERFERENCE OF URINE PIGMENT   Hgb urine dipstick   (*)    Value: TEST NOT REPORTED DUE TO COLOR INTERFERENCE OF URINE PIGMENT   Bilirubin Urine   (*)    Value: TEST NOT REPORTED DUE TO COLOR INTERFERENCE OF URINE PIGMENT   Ketones, ur   (*)    Value: TEST NOT REPORTED DUE TO COLOR INTERFERENCE OF URINE PIGMENT   Protein, ur   (*)    Value: TEST NOT REPORTED DUE TO COLOR INTERFERENCE OF URINE PIGMENT   Nitrite   (*)    Value: TEST NOT REPORTED DUE TO COLOR INTERFERENCE OF URINE PIGMENT   Leukocytes,Ua   (*)    Value: TEST NOT REPORTED  DUE TO COLOR INTERFERENCE OF URINE PIGMENT   All other components within normal limits  CBC  TYPE AND SCREEN     EKG  Not indicated.   RADIOLOGY  Image and radiology report reviewed and interpreted by me. Radiology report consistent with the same.  Uterine fibroid noted on pelvic ultrasound.  PROCEDURES:  Critical Care performed: No  Procedures   MEDICATIONS ORDERED IN ED:  Medications - No data to display   IMPRESSION / MDM / ASSESSMENT AND PLAN / ED COURSE   I have reviewed the triage note.  Differential diagnosis includes, but is not limited to, fibroid, cervical/uterine malignancy, ovarian cyst  Patient's presentation is most consistent with acute illness / injury with system symptoms.  63 year old female presenting to the emergency department for treatment and evaluation of vaginal bleeding.  See HPI for further details.  Vital signs are stable.  Outpatient records reviewed.  Blood pressure of 104/52 is near patient's baseline.  Lab studies are reassuring.  Hemoglobin and hematocrit are normal.  BUN of 28 and a creatinine of 1.12 it is at patient's baseline.  Ultrasound shows uterine fibroids which is likely the cause of her bleeding.   She is to call her primary care provider for follow-up if she is unable to get in with her gynecologist before her scheduled appointment in early July.  She was advised to return to the ER if bleeding is 1 pad or more per hour.      FINAL CLINICAL IMPRESSION(S) / ED DIAGNOSES   Final diagnoses:  Uterine leiomyoma, unspecified location     Rx / DC Orders   ED Discharge Orders     None        Note:  This document was prepared using Dragon voice recognition software and may include unintentional dictation errors.   Sherryle Don, FNP 04/09/24 1400    Bryson Carbine, MD 04/11/24 [phone]

## 2024-04-14 ENCOUNTER — Telehealth: Payer: Self-pay | Admitting: Family

## 2024-04-14 NOTE — Telephone Encounter (Signed)
 Lvm to address pt medication questions.

## 2024-04-15 ENCOUNTER — Other Ambulatory Visit: Payer: PRIVATE HEALTH INSURANCE

## 2024-04-15 DIAGNOSIS — I4891 Unspecified atrial fibrillation: Secondary | ICD-10-CM

## 2024-04-15 MED ORDER — SACUBITRIL-VALSARTAN 49-51 MG PO TABS: 1.0000 | ORAL_TABLET | Freq: Two times a day (BID) | ORAL | 3 refills | Status: DC

## 2024-04-15 MED ORDER — APIXABAN 5 MG PO TABS: 5.0000 mg | ORAL_TABLET | Freq: Two times a day (BID) | ORAL | 3 refills | Status: AC

## 2024-04-15 NOTE — Progress Notes (Signed)
 04/15/2024 Name: Tammy Chavez MRN: [id number] DOB: 09/20/1961  Chief Complaint  Patient presents with   Medication Assistance   Tammy Chavez is a 63 y.o. year old female who presented for a telephone visit.   They were referred to the pharmacist by their PCP for assistance in managing medication access.   Subjective:  Care Team: Primary Care Provider: Hadassah Letters, MD ; Next Scheduled Visit: 07/01/2024  Medication Access/Adherence  Current Pharmacy:  [facility] [address] [place] - [address] [place] [address] Phone: [phone] Fax: [phone]  [facility] [address], [place] - [address] [place] [address] Phone: [phone] Fax: [phone]  -Patient reports affordability concerns with their medications: Yes  Patient is prescribed Eliquis  5mg  BID, Farxiga  10mg  daily, and Entresto  49-51mg  BID and each have monthly copays of $35.  States she had manufacturer copay cards in the past that made prices more affordable, but these have not worked/been applied recently.  Patient is currently due for a refill of Entresto  and would to get 3 month supplies of this, Farxiga  and Eliquis  moving forward. -Patient reports access/transportation concerns to their pharmacy: No  -Patient reports adherence concerns with their medications:  No    Objective:  Lab Results  Component Value Date   HGBA1C 6.2 (H) 06/30/2023   Lab Results  Component Value Date   CREATININE 1.12 (H) 04/06/2024   BUN 28 (H) 04/06/2024   NA 139 04/06/2024   K 3.7 04/06/2024   CL 104 04/06/2024   CO2 26 04/06/2024   Medications Reviewed Today     Reviewed by Linn Rich, RPH (Pharmacist) on 04/15/24 at 1049  Med List Status: <None>   Medication Order Taking? Sig Documenting Provider Last Dose Status Informant  albuterol  (VENTOLIN  HFA) 108 (90 Base) MCG/ACT inhaler [id number] Yes Inhale 2 puffs into the lungs every 6 (six) hours as  needed for wheezing or shortness of breath. Suellyn Emory, MD  Active Self           Med Note Alejo Hurter, Erica Hau   Fri Sep 11, 2023 10:39 AM) PRN, hasn't needed to use in months  allopurinol  (ZYLOPRIM ) 300 MG tablet [id number] Yes Take 1 tablet (300 mg total) by mouth daily. Hadassah Letters, MD  Active   apixaban  (ELIQUIS ) 5 MG TABS tablet [id number] Yes Take 1 tablet (5 mg total) by mouth 2 (two) times daily. Darlis Eisenmenger, MD  Active   atorvastatin  (LIPITOR) 80 MG tablet [id number] Yes Take 1 tablet (80 mg total) by mouth daily. [place], Adell Hones, NP  Active   bumetanide  (BUMEX ) 1 MG tablet [id number] Yes Take 1 tablet (1 mg total) by mouth daily. Charlette Console, [place]  Active Self  Cyanocobalamin 5000 MCG TBDP [id number] Yes Take 5,000 mcg by mouth daily. [provider]  Active Self  FARXIGA  10 MG TABS tablet [id number] Yes Take 1 tablet (10 mg total) by mouth daily before breakfast. NEEDS FOLLOW UP APPOINTMENT FOR MORE REFILLS Hadassah Letters, MD  Active   fenofibrate  160 MG tablet [id number] Yes Take 1 tablet (160 mg total) by mouth daily. [place], Adell Hones, NP  Active   metoprolol  succinate (TOPROL -XL) 100 MG 24 hr tablet [id number] Yes Take 1 tablet (100 mg total) by mouth daily. Take with or immediately following a meal. [place] Greaser, Adell Hones, NP  Active   sacubitril -valsartan  (ENTRESTO ) 49-51 MG [id number] Yes Take 1 tablet  by mouth 2 (two) times daily. Darlis Eisenmenger, MD  Active   spironolactone  (ALDACTONE ) 25 MG tablet [id number] Yes Take 0.5 tablets (12.5 mg total) by mouth daily. [place], Adell Hones, NP  Active            Assessment/Plan:   Medication Access/Adherence -Pending 90 day prescriptions for Entresto  and Eliquis  for cardiology to sign if in agreement -Pending 90 day prescription for Farxiga  for Dr. Juliette Oh to sign if in agreement -Obtained manufacturer copay cards for all 3 medications:  Entresto  BIN [id number] PCN OHCP GRP [id number] ID  [id number]  Farxiga  BIN# [id number] PCN# [id number] GRP# [id number] ID# [id number]  Eliquis  BIN [id number] PCN [employer] GRP [id number] ID [id number]  Follow Up Plan: Once 90 day orders are signed, I will contact pharmacy to provide copay card for Entresto .  Sending Farxiga  and Eliquis  copay card information to patient via MyChart message since these medications are not yet due for refill  Linn Rich, PharmD, DPLA

## 2024-04-15 NOTE — Progress Notes (Signed)
   04/15/2024  Patient ID: Tammy Chavez, female   DOB: June 10, 1961, 63 y.o.   MRN: [id number]  Orders signed by cardiology for 90 day refills of Entresto  and Eliqius.  Contacted [facility] to provide copay card information for Entresto .  Three month supply of medication is not covered by insurance; possibly because of high cost of medication, or insurance may prefer patient use mail order.  One month supply is going through for $10.  Linn Rich, PharmD, DPLA

## 2024-04-19 ENCOUNTER — Other Ambulatory Visit: Payer: Self-pay | Admitting: Nurse Practitioner

## 2024-04-19 DIAGNOSIS — E782 Mixed hyperlipidemia: Secondary | ICD-10-CM

## 2024-04-28 ENCOUNTER — Encounter: Payer: PRIVATE HEALTH INSURANCE | Admitting: Obstetrics and Gynecology

## 2024-05-05 ENCOUNTER — Encounter: Payer: PRIVATE HEALTH INSURANCE | Admitting: Obstetrics and Gynecology

## 2024-05-09 ENCOUNTER — Encounter: Payer: Self-pay | Admitting: Ophthalmology

## 2024-05-10 ENCOUNTER — Encounter: Payer: Self-pay | Admitting: Ophthalmology

## 2024-05-10 NOTE — Anesthesia Preprocedure Evaluation (Addendum)
 Anesthesia Evaluation    Airway Mallampati: III  TM Distance: <3 FB Neck ROM: Full    Dental no notable dental hx.    Pulmonary    Pulmonary exam normal breath sounds clear to auscultation       Cardiovascular hypertension, + CAD  Normal cardiovascular exam Rhythm:Regular Rate:Normal  07-10-23 echo  1. Left ventricular ejection fraction, by estimation, is 20 to 25%. Left  ventricular ejection fraction by 2D MOD biplane is 19.8 %. The left  ventricle has severely decreased function. The left ventricle demonstrates  global hypokinesis. The left  ventricular internal cavity size was mildly dilated. Left ventricular  diastolic parameters are consistent with Grade II diastolic dysfunction  (pseudonormalization).   2. Right ventricular systolic function is low normal. The right  ventricular size is normal.   3. The mitral valve is normal in structure. Mild mitral valve  regurgitation.   4. The aortic valve is grossly normal. Aortic valve regurgitation is not  visualized.   5. The inferior vena cava is normal in size with greater than 50%  respiratory variability, suggesting right atrial pressure of 3 mmHg.   08-26-23  Mid Cx lesion is 30% stenosed.   Ost LAD to Prox LAD lesion is 60% stenosed.   Mid LAD lesion is 30% stenosed.   Dist LAD lesion is 20% stenosed.   1. Normal/low filling pressures.  2. Low but not markedly low cardiac output.  3. There is a moderate 60% proximal LAD stenosis.  Good flow down LAD, I do not think this is the cause of her cardiomyopathy but she will need aggressive secondary prevention of CAD.     cath   10-09-23 FINDINGS:  Limited images of the lung fields showed no gross abnormalities.   Normal left ventricular size and wall thickness. Global hypokinesis  with LV EF 41%. Normal right ventricular size and systolic function,  RV EF 47%. Mild left atrial enlargement. Normal right atrial size.   Mildly thickened, trileaflet aortic valve. No stenosis, mild  regurgitation (regurgitant fraction 10%). There was mild mitral  regurgitation, regurgitant fraction 18%.   On delayed enhancement imaging, there was mid-wall late gadolinium  enhancement in the mid inferoseptal wall at the inferior RV  insertion site.   MEASUREMENTS:  MEASUREMENTS  LVEDV 139 mL   LVEDVi 63 mL/m2   LVSV 56 mL  LVEF 41%   RVEDV 94 mL   RVEDVi 43 mL/m2   RVSV 44 mL  RVEF 47%   Aortic forward volume 46 mL   Aortic regurgitant percentage 10%   T1 1016, ECV 24%   IMPRESSION:  1. Normal LV size with mild global hypokinesis, EF 41%.   2.  Normal RV size with mildly decreased systolic function, EF 47%.   3. Nonspecific LGE at the mid inferoseptal RV insertion site. This  can be seen with LV pressure/volume overload.   4.  Normal extracellular volume percentage.   Tammy Chavez  Electronically Signed    By: Ezra Shuck M.D.    On: 10/12/2023 07:49      Neuro/Psych    GI/Hepatic   Endo/Other    Renal/GU      Musculoskeletal   Abdominal   Peds  Hematology  (+) Blood dyscrasia, anemia   Anesthesia Other Findings Hyperlipidemia  Hypertension Gout  Heel spur Chronic diastolic heart failure (HCC)  Mild mitral regurgitation by prior echocardiogram Mild mitral regurgitation by prior echocardiogram  Grade II diastolic dysfunction CAD (coronary artery disease) Uncontrolled hypertension Atrial  fibrillation (HCC)   Snores, has high chance of sleep apnea, has not been tested for sleep apnea, discussed sleep study and risks untreated sleep apnea, she agrees will be tested for sleep apnea  Reproductive/Obstetrics                              Anesthesia Physical Anesthesia Plan  ASA: 4  Anesthesia Plan: MAC   Post-op Pain Management:    Induction: Intravenous  PONV Risk Score and Plan:   Airway Management Planned: Natural Airway and Nasal  Cannula  Additional Equipment:   Intra-op Plan:   Post-operative Plan:   Informed Consent: I have reviewed the patients History and Physical, chart, labs and discussed the procedure including the risks, benefits and alternatives for the proposed anesthesia with the patient or authorized representative who has indicated his/her understanding and acceptance.     Dental Advisory Given  Plan Discussed with: Anesthesiologist, CRNA and Surgeon  Anesthesia Plan Comments: (Patient consented for risks of anesthesia including but not limited to:  - adverse reactions to medications - damage to eyes, teeth, lips or other oral mucosa - nerve damage due to positioning  - sore throat or hoarseness - Damage to heart, brain, nerves, lungs, other parts of body or loss of life  Patient voiced understanding and assent.)         Anesthesia Quick Evaluation

## 2024-05-13 NOTE — Telephone Encounter (Addendum)
 Called to follow up on medication questions. Pt wanted to verify her medications. No further questions at this time.

## 2024-05-16 NOTE — Discharge Instructions (Signed)

## 2024-05-18 ENCOUNTER — Ambulatory Visit: Payer: PRIVATE HEALTH INSURANCE | Admitting: Anesthesiology

## 2024-05-18 ENCOUNTER — Encounter
Admission: RE | Disposition: A | Discharge status: Home / Self Care | Payer: Self-pay | Source: Home / Self Care | Attending: Ophthalmology

## 2024-05-18 ENCOUNTER — Ambulatory Visit
Admission: RE | Admit: 2024-05-18 | Discharge: 2024-05-18 | Disposition: A | Discharge status: Home / Self Care | Payer: PRIVATE HEALTH INSURANCE | Attending: Ophthalmology | Admitting: Ophthalmology

## 2024-05-18 ENCOUNTER — Other Ambulatory Visit: Payer: Self-pay

## 2024-05-18 ENCOUNTER — Encounter: Payer: Self-pay | Admitting: Ophthalmology

## 2024-05-18 DIAGNOSIS — I340 Nonrheumatic mitral (valve) insufficiency: Secondary | ICD-10-CM | POA: Insufficient documentation

## 2024-05-18 DIAGNOSIS — H2512 Age-related nuclear cataract, left eye: Secondary | ICD-10-CM | POA: Insufficient documentation

## 2024-05-18 DIAGNOSIS — I5032 Chronic diastolic (congestive) heart failure: Secondary | ICD-10-CM | POA: Diagnosis not present

## 2024-05-18 DIAGNOSIS — I2510 Atherosclerotic heart disease of native coronary artery without angina pectoris: Secondary | ICD-10-CM | POA: Insufficient documentation

## 2024-05-18 DIAGNOSIS — I110 Hypertensive heart disease with heart failure: Secondary | ICD-10-CM | POA: Insufficient documentation

## 2024-05-18 HISTORY — DX: Chronic diastolic (congestive) heart failure: I50.32

## 2024-05-18 HISTORY — DX: Other ill-defined heart diseases: I51.89

## 2024-05-18 HISTORY — PX: CATARACT EXTRACTION W/PHACO: SHX586

## 2024-05-18 HISTORY — DX: Essential (primary) hypertension: I10

## 2024-05-18 HISTORY — DX: Atherosclerotic heart disease of native coronary artery without angina pectoris: I25.10

## 2024-05-18 HISTORY — DX: Unspecified atrial fibrillation: I48.91

## 2024-05-18 HISTORY — DX: Nonrheumatic mitral (valve) insufficiency: I34.0

## 2024-05-18 SURGERY — PHACOEMULSIFICATION, CATARACT, WITH IOL INSERTION
Anesthesia: Monitor Anesthesia Care | Site: Eye | Laterality: Left

## 2024-05-18 MED ORDER — BRIMONIDINE TARTRATE-TIMOLOL 0.2-0.5 % OP SOLN
OPHTHALMIC | Status: DC | PRN
Start: 2024-05-18 — End: 2024-05-18
  Administered 2024-05-18: 1 [drp] via OPHTHALMIC

## 2024-05-18 MED ORDER — TETRACAINE HCL 0.5 % OP SOLN
1.0000 [drp] | OPHTHALMIC | Status: DC | PRN
  Administered 2024-05-18 (×3): 1 [drp] via OPHTHALMIC

## 2024-05-18 MED ORDER — ARMC OPHTHALMIC DILATING DROPS
OPHTHALMIC | Status: AC
  Filled 2024-05-18: qty 0.5

## 2024-05-18 MED ORDER — SIGHTPATH DOSE#1 BSS IO SOLN
INTRAOCULAR | Status: DC | PRN
  Administered 2024-05-18: 62 mL via OPHTHALMIC

## 2024-05-18 MED ORDER — MOXIFLOXACIN HCL 0.5 % OP SOLN: OPHTHALMIC | Status: DC | PRN

## 2024-05-18 MED ORDER — FENTANYL CITRATE (PF) 100 MCG/2ML IJ SOLN
INTRAMUSCULAR | Status: AC
  Filled 2024-05-18: qty 2

## 2024-05-18 MED ORDER — SIGHTPATH DOSE#1 NA HYALUR & NA CHOND-NA HYALUR IO KIT
PACK | INTRAOCULAR | Status: DC | PRN
Start: 2024-05-18 — End: 2024-05-18
  Administered 2024-05-18: 1 via OPHTHALMIC

## 2024-05-18 MED ORDER — LACTATED RINGERS IV SOLN: INTRAVENOUS | Status: DC

## 2024-05-18 MED ORDER — SIGHTPATH DOSE#1 BSS IO SOLN
INTRAOCULAR | Status: DC | PRN
  Administered 2024-05-18: 15 mL via INTRAOCULAR

## 2024-05-18 MED ORDER — MIDAZOLAM HCL 2 MG/2ML IJ SOLN
INTRAMUSCULAR | Status: AC
  Filled 2024-05-18: qty 2

## 2024-05-18 MED ORDER — LIDOCAINE HCL (PF) 2 % IJ SOLN
INTRAMUSCULAR | Status: DC | PRN
  Administered 2024-05-18: 1 mL

## 2024-05-18 MED ORDER — MIDAZOLAM HCL 2 MG/2ML IJ SOLN
INTRAMUSCULAR | Status: DC | PRN
  Administered 2024-05-18 (×2): 1 mg via INTRAVENOUS

## 2024-05-18 MED ORDER — ARMC OPHTHALMIC DILATING DROPS
1.0000 | OPHTHALMIC | Status: DC | PRN
  Administered 2024-05-18 (×3): 1 via OPHTHALMIC

## 2024-05-18 MED ORDER — FENTANYL CITRATE (PF) 100 MCG/2ML IJ SOLN
INTRAMUSCULAR | Status: DC | PRN
  Administered 2024-05-18: 50 ug via INTRAVENOUS

## 2024-05-18 MED ORDER — CEFUROXIME OPHTHALMIC INJECTION 1 MG/0.1 ML
INJECTION | OPHTHALMIC | Status: DC | PRN
  Administered 2024-05-18: .1 mL via INTRACAMERAL

## 2024-05-18 MED ORDER — TETRACAINE HCL 0.5 % OP SOLN
OPHTHALMIC | Status: AC
  Filled 2024-05-18: qty 4

## 2024-05-18 SURGICAL SUPPLY — 9 items
CATARACT SUITE SIGHTPATH (MISCELLANEOUS) ×1 IMPLANT
FEE CATARACT SUITE SIGHTPATH (MISCELLANEOUS) ×1 IMPLANT
GLOVE BIOGEL PI IND STRL 8 (GLOVE) ×1 IMPLANT
GLOVE SURG LX STRL 7.5 STRW (GLOVE) ×1 IMPLANT
GLOVE SURG SYN 6.5 PF PI BL (GLOVE) ×1 IMPLANT
LENS IOL TECNIS EYHANCE 17.0 (Intraocular Lens) IMPLANT
NDL FILTER BLUNT 18X1 1/2 (NEEDLE) ×1 IMPLANT
NEEDLE FILTER BLUNT 18X1 1/2 (NEEDLE) ×1 IMPLANT
SYR 3ML LL SCALE MARK (SYRINGE) ×1 IMPLANT

## 2024-05-18 NOTE — Anesthesia Postprocedure Evaluation (Signed)
 Anesthesia Post Note  Patient: [name]  Procedure(s) Performed: PHACOEMULSIFICATION, CATARACT, WITH IOL INSERTION 9.73, 00:46.3 (Left: Eye)  Patient location during evaluation: PACU Anesthesia Type: MAC Level of consciousness: awake and alert Pain management: pain level controlled Vital Signs Assessment: post-procedure vital signs reviewed and stable Respiratory status: spontaneous breathing, nonlabored ventilation, respiratory function stable and patient connected to nasal cannula oxygen Cardiovascular status: stable and blood pressure returned to baseline Postop Assessment: no apparent nausea or vomiting Anesthetic complications: no   No notable events documented.   Last Vitals:  Vitals:   05/18/24 1141 05/18/24 1146  BP: (!) 127/57 (!) 131/53  Pulse: (!) 58 (!) 50  Resp: 14 18  Temp: (!) 36.4 C (!) 36.4 C  SpO2: 95% 96%    Last Pain:  Vitals:   05/18/24 1146  PainSc: 0-No pain                 Amanda Pote C Chardonnay Holzmann

## 2024-05-18 NOTE — Transfer of Care (Signed)
 Immediate Anesthesia Transfer of Care Note  Patient: Tammy Chavez  Procedure(s) Performed: PHACOEMULSIFICATION, CATARACT, WITH IOL INSERTION 9.73, 00:46.3 (Left: Eye)  Patient Location: PACU  Anesthesia Type: MAC  Level of Consciousness: awake, alert  and patient cooperative  Airway and Oxygen Therapy: Patient Spontanous Breathing and Patient connected to supplemental oxygen  Post-op Assessment: Post-op Vital signs reviewed, Patient's Cardiovascular Status Stable, Respiratory Function Stable, Patent Airway and No signs of Nausea or vomiting  Post-op Vital Signs: Reviewed and stable  Complications: No notable events documented.

## 2024-05-18 NOTE — Op Note (Signed)
 OPERATIVE NOTE  Tammy Chavez [id number] 05/18/2024   PREOPERATIVE DIAGNOSIS:  Nuclear sclerotic cataract left eye. H25.12   POSTOPERATIVE DIAGNOSIS:    Nuclear sclerotic cataract left eye.     PROCEDURE:  Phacoemusification with posterior chamber intraocular lens placement of the left eye  Ultrasound time: Procedure(s): PHACOEMULSIFICATION, CATARACT, WITH IOL INSERTION 9.73, 00:46.3 (Left)  LENS:   Implant Name Type Inv. Item Serial No. Manufacturer Lot No. LRB No. Used Action  LENS IOL TECNIS EYHANCE 17.0 - [id number] Intraocular Lens LENS IOL TECNIS EYHANCE 17.0 [id number] [employer]  Left 1 Implanted      SURGEON:  Dene FABIENE Etienne, MD   ANESTHESIA:  Topical with tetracaine  drops and 2% Xylocaine  jelly, augmented with 1% preservative-free intracameral lidocaine .    COMPLICATIONS:  None.   DESCRIPTION OF PROCEDURE:  The patient was identified in the holding room and transported to the operating room and placed in the supine position under the operating microscope.  The left eye was identified as the operative eye and it was prepped and draped in the usual sterile ophthalmic fashion.   A 1 millimeter clear-corneal paracentesis was made at the 1:30 position.  0.5 ml of preservative-free 1% lidocaine  was injected into the anterior chamber.  The anterior chamber was filled with Viscoat viscoelastic.  A 2.4 millimeter keratome was used to make a near-clear corneal incision at the 10:30 position.  .  A curvilinear capsulorrhexis was made with a cystotome and capsulorrhexis forceps.  Balanced salt solution was used to hydrodissect and hydrodelineate the nucleus.   Phacoemulsification was then used in stop and chop fashion to remove the lens nucleus and epinucleus.  The remaining cortex was then removed using the irrigation and aspiration handpiece. Provisc was then placed into the capsular bag to distend it for lens placement.  A lens was then injected into the capsular bag.  The  remaining viscoelastic was aspirated.   Wounds were hydrated with balanced salt solution.  The anterior chamber was inflated to a physiologic pressure with balanced salt solution.  No wound leaks were noted. Cefuroxime  0.1 ml of a 10mg /ml solution was injected into the anterior chamber for a dose of 1 mg of intracameral antibiotic at the completion of the case.   Timolol  and Brimonidine  drops were applied to the eye.  The patient was taken to the recovery room in stable condition without complications of anesthesia or surgery.  [name] 05/18/2024, 11:40 AM

## 2024-05-18 NOTE — H&P (Signed)
 [facility]   Primary Care Physician:  Tammy Chavez, Tammy Chavez Ophthalmologist: Dr. Dene Chavez  Pre-Procedure History & Physical: HPI:  Tammy Chavez is a 63 y.o. female here for ophthalmic surgery.   Past Medical History:  Diagnosis Date   Atrial fibrillation (HCC)    CAD (coronary artery disease)    Chronic diastolic heart failure (HCC)    Gout    Grade II diastolic dysfunction    Heel spur    Left   Hyperlipidemia    Hypertension    Mild mitral regurgitation by prior echocardiogram    Mild mitral regurgitation by prior echocardiogram    Uncontrolled hypertension     Past Surgical History:  Procedure Laterality Date   ACHILLES TENDON REPAIR     RIGHT/LEFT HEART CATH AND CORONARY ANGIOGRAPHY Bilateral 08/26/2023   Procedure: RIGHT/LEFT HEART CATH AND CORONARY ANGIOGRAPHY;  Surgeon: Tammy Chavez, Tammy Chavez;  Location: [facility] INVASIVE CV LAB;  Service: Cardiovascular;  Laterality: Bilateral;    Prior to Admission medications   Medication Sig Start Date End Date Taking? Authorizing Provider  allopurinol  (ZYLOPRIM ) 300 MG tablet Take 1 tablet (300 mg total) by mouth daily. 03/30/24  Yes Tammy Chavez, Tammy Chavez  apixaban  (ELIQUIS ) 5 MG TABS tablet Take 1 tablet (5 mg total) by mouth 2 (two) times daily. 04/15/24  Yes Chavez, Tammy D, NP  atorvastatin  (LIPITOR) 80 MG tablet Take 1 tablet (80 mg total) by mouth daily. 03/08/24  Yes Tammy Morton Hummer, Nena, NP  bumetanide  (BUMEX ) 1 MG tablet Take 1 tablet (1 mg total) by mouth daily. 06/09/23 06/03/24 Yes Hackney, Tina A, FNP  Cyanocobalamin 5000 MCG TBDP Take 5,000 mcg by mouth daily.   Yes Provider, Historical, Tammy Chavez  FARXIGA  10 MG TABS tablet Take 1 tablet (10 mg total) by mouth daily before breakfast. NEEDS FOLLOW UP APPOINTMENT FOR MORE REFILLS 03/30/24  Yes Tammy Chavez, Tammy Chavez  fenofibrate  160 MG tablet Take 1 tablet (160 mg total) by mouth daily. 03/08/24  Yes Tammy Morton Hummer, Nena, NP  metoprolol  succinate (TOPROL -XL) 100 MG  24 hr tablet Take 1 tablet (100 mg total) by mouth daily. Take with or immediately following a meal. 03/08/24 06/06/24 Yes Tammy Morton Hummer, Nena, NP  sacubitril -valsartan  (ENTRESTO ) 49-51 MG Take 1 tablet by mouth 2 (two) times daily. 04/15/24  Yes Chavez, Tammy D, NP  spironolactone  (ALDACTONE ) 25 MG tablet Take 0.5 tablets (12.5 mg total) by mouth daily. 03/08/24 06/06/24 Yes Tammy Morton Hummer, Nena, NP  albuterol  (VENTOLIN  HFA) 108 [phone] Base) MCG/ACT inhaler Inhale 2 puffs into the lungs every 6 (six) hours as needed for wheezing or shortness of breath. 05/26/23   Lang Dover, Tammy Chavez    Allergies as of 04/21/2024 - Reviewed 04/06/2024  Allergen Reaction Noted   Lisinopril  03/24/2013    Family History  Problem Relation Age of Onset   Heart disease Mother    Diabetes Sister    Hyperlipidemia Brother    Hyperlipidemia Brother    Hyperlipidemia Brother    Diabetes Sister     Social History   Socioeconomic History   Marital status: Single    Spouse name: Not on file   Number of children: Not on file   Years of education: Not on file   Highest education level: Associate degree: occupational, Scientist, product/process development, or vocational program  Occupational History   Not on file  Tobacco Use   Smoking status: Never   Smokeless tobacco: Never  Vaping Use   Vaping status:  Never Used  Substance and Sexual Activity   Alcohol use: No   Drug use: No   Sexual activity: Not Currently  Other Topics Concern   Not on file  Social History Narrative   Lives with [name], sister.    Social Drivers of Health   Financial Resource Strain: Medium Risk (03/07/2024)   Overall Financial Resource Strain (CARDIA)    Difficulty of Paying Living Expenses: Somewhat hard  Food Insecurity: Food Insecurity Present (03/07/2024)   Hunger Vital Sign    Worried About Running Out of Food in the Last Year: Sometimes true    Ran Out of Food in the Last Year: Sometimes true  Transportation Needs: No Transportation Needs  (03/07/2024)   PRAPARE - Administrator, Civil Service (Medical): No    Lack of Transportation (Non-Medical): No  Physical Activity: Insufficiently Active (03/07/2024)   Exercise Vital Sign    Days of Exercise per Week: 2 days    Minutes of Exercise per Session: 30 min  Stress: No Stress Concern Present (03/07/2024)   [employer] of Occupational Health - Occupational Stress Questionnaire    Feeling of Stress : Only a little  Social Connections: Unknown (03/07/2024)   Social Connection and Isolation Panel    Frequency of Communication with Friends and Family: More than three times a week    Frequency of Social Gatherings with Friends and Family: Once a week    Attends Religious Services: Patient declined    Database administrator or Organizations: No    Attends Engineer, structural: Not on file    Marital Status: Never married  Intimate Partner Violence: Unknown (11/26/2022)   Received from [facility]   Intimate Partner Violence    Feels physically and emotionally safe: Not on file    Fear of partner: Not on file    Review of Systems: See HPI, otherwise negative ROS  Physical Exam: BP (!) 140/105   Pulse (!) 54   Temp (!) 97.2 F (36.2 C)   Resp 18   Ht 5' (1.524 m)   Wt 111.1 kg   LMP 11/03/2013   SpO2 97%   BMI 47.85 kg/m  General:   Alert,  pleasant and cooperative in NAD Head:  Normocephalic and atraumatic. Lungs:  Clear to auscultation.    Heart:  Regular rate and rhythm.   Impression/Plan: Tammy Chavez is here for ophthalmic surgery.  Risks, benefits, limitations, and alternatives regarding ophthalmic surgery have been reviewed with the patient.  Questions have been answered.  All parties agreeable.   Tammy GASKIN, Tammy Chavez  05/18/2024, 11:02 AM

## 2024-05-24 ENCOUNTER — Ambulatory Visit: Payer: PRIVATE HEALTH INSURANCE | Admitting: Obstetrics & Gynecology

## 2024-05-24 ENCOUNTER — Encounter: Payer: Self-pay | Admitting: Obstetrics & Gynecology

## 2024-05-24 ENCOUNTER — Other Ambulatory Visit (HOSPITAL_COMMUNITY)
Admission: RE | Admit: 2024-05-24 | Discharge: 2024-05-24 | Disposition: A | Discharge status: Home / Self Care | Payer: PRIVATE HEALTH INSURANCE | Source: Ambulatory Visit | Attending: Obstetrics & Gynecology | Admitting: Obstetrics & Gynecology

## 2024-05-24 VITALS — BP 136/61 | HR 56 | Ht 60.0 in | Wt 257.0 lb

## 2024-05-24 DIAGNOSIS — N888 Other specified noninflammatory disorders of cervix uteri: Secondary | ICD-10-CM

## 2024-05-24 DIAGNOSIS — N950 Postmenopausal bleeding: Secondary | ICD-10-CM

## 2024-05-24 DIAGNOSIS — Z1151 Encounter for screening for human papillomavirus (HPV): Secondary | ICD-10-CM

## 2024-05-24 DIAGNOSIS — Z1231 Encounter for screening mammogram for malignant neoplasm of breast: Secondary | ICD-10-CM

## 2024-05-24 DIAGNOSIS — Z01419 Encounter for gynecological examination (general) (routine) without abnormal findings: Secondary | ICD-10-CM

## 2024-05-24 DIAGNOSIS — Z124 Encounter for screening for malignant neoplasm of cervix: Secondary | ICD-10-CM | POA: Insufficient documentation

## 2024-05-24 DIAGNOSIS — R9389 Abnormal findings on diagnostic imaging of other specified body structures: Secondary | ICD-10-CM | POA: Diagnosis not present

## 2024-05-24 NOTE — Progress Notes (Signed)
 ANNUAL PREVENTATIVE CARE GYNECOLOGY  ENCOUNTER NOTE  Subjective:       Tammy Chavez is a 63 y.o. single P0 here for a routine annual gynecologic exam. She is new to this practice.The patient has never been sexually active. The patient has never been taking hormone replacement therapy. Patient reports post-menopausal vaginal bleeding. She sees faint red with wiping, nothing as heavy as it was in June. The patient wears seatbelts: yes. The patient participates in regular exercise: no. Has the patient ever been transfused or tattooed?: no. The patient reports that there is not domestic violence in her life.  Current complaints: 1.  She was seen in the  ED 04/06/2024 for 2 weeks of PMB. An ultrasound there showed a endometrium of 8.8 mm and a small fibroid.    Gynecologic History Patient's last menstrual period was 11/03/2013.  Last Pap: 2021 or 2022. Results were: normal, She denies a h/o abnormal paps. Last mammogram: 2023. Results were: normal Both of those tests were in [place].  Last Colonoscopy: 2022, good for 10 years reportedly    Obstetric History OB History  No obstetric history on file.    Past Medical History:  Diagnosis Date   Atrial fibrillation (HCC)    CAD (coronary artery disease)    Chronic diastolic heart failure (HCC)    Gout    Grade II diastolic dysfunction    Heel spur    Left   Hyperlipidemia    Hypertension    Mild mitral regurgitation by prior echocardiogram    Mild mitral regurgitation by prior echocardiogram    Uncontrolled hypertension     Family History  Problem Relation Age of Onset   Heart disease Mother    Diabetes Sister    Diabetes Sister    Hyperlipidemia Brother    Hyperlipidemia Brother    Hyperlipidemia Brother    Bladder Cancer Maternal Uncle     Past Surgical History:  Procedure Laterality Date   ACHILLES TENDON REPAIR     CATARACT EXTRACTION W/PHACO Left 05/18/2024   Procedure: PHACOEMULSIFICATION, CATARACT, WITH IOL  INSERTION 9.73, 00:46.3;  Surgeon: Mittie Gaskin, MD;  Location: [facility];  Service: Ophthalmology;  Laterality: Left;   RIGHT/LEFT HEART CATH AND CORONARY ANGIOGRAPHY Bilateral 08/26/2023   Procedure: RIGHT/LEFT HEART CATH AND CORONARY ANGIOGRAPHY;  Surgeon: Rolan Ezra RAMAN, MD;  Location: [facility] INV[facility] CV [facility];  Service: Cardiovascular;  Laterality: Bilateral;    Social History   Socioeconomic History   Marital status: Single    Spouse name: Not on file   Number of children: Not on file   Years of education: Not on file   Highest education level: Associate degree: occupational, Scientist, product/process development, or vocational program  Occupational History   Not on file  Tobacco Use   Smoking status: Never   Smokeless tobacco: Never  Vaping Use   Vaping status: Never Used  Substance and Sexual Activity   Alcohol use: No   Drug use: No   Sexual activity: Not Currently  Other Topics Concern   Not on file  Social History Narrative   Lives with [name], sister.    Social Drivers of Health   Financial Resource Strain: Medium Risk (03/07/2024)   Overall Financial Resource Strain (CARDIA)    Difficulty of Paying Living Expenses: Somewhat hard  Food Insecurity: Food Insecurity Present (03/07/2024)   Hunger Vital Sign    Worried About Running Out of Food in the Last Year: Sometimes true    Ran Out of Food  in the Last Year: Sometimes true  Transportation Needs: No Transportation Needs (03/07/2024)   PRAPARE - Administrator, Civil Service (Medical): No    Lack of Transportation (Non-Medical): No  Physical Activity: Insufficiently Active (03/07/2024)   Exercise Vital Sign    Days of Exercise per Week: 2 days    Minutes of Exercise per Session: 30 min  Stress: No Stress Concern Present (03/07/2024)   [employer] of Occupational Health - Occupational Stress Questionnaire    Feeling of Stress : Only a little  Social Connections: Unknown (03/07/2024)   Social Connection and  Isolation Panel    Frequency of Communication with Friends and Family: More than three times a week    Frequency of Social Gatherings with Friends and Family: Once a week    Attends Religious Services: Patient declined    Database administrator or Organizations: No    Attends Engineer, structural: Not on file    Marital Status: Never married  Intimate Partner Violence: Unknown (11/26/2022)   Received from [facility]   Intimate Partner Violence    Feels physically and emotionally safe: Not on file    Fear of partner: Not on file    Current Outpatient Medications on File Prior to Visit  Medication Sig Dispense Refill   albuterol  (VENTOLIN  HFA) 108 (90 Base) MCG/ACT inhaler Inhale 2 puffs into the lungs every 6 (six) hours as needed for wheezing or shortness of breath. 8 g 2   allopurinol  (ZYLOPRIM ) 300 MG tablet Take 1 tablet (300 mg total) by mouth daily. 90 tablet 3   apixaban  (ELIQUIS ) 5 MG TABS tablet Take 1 tablet (5 mg total) by mouth 2 (two) times daily. 180 tablet 3   atorvastatin  (LIPITOR) 80 MG tablet Take 1 tablet (80 mg total) by mouth daily. 90 tablet 0   Cyanocobalamin 5000 MCG TBDP Take 5,000 mcg by mouth daily.     FARXIGA  10 MG TABS tablet Take 1 tablet (10 mg total) by mouth daily before breakfast. NEEDS FOLLOW UP APPOINTMENT FOR MORE REFILLS 90 tablet 3   fenofibrate  160 MG tablet Take 1 tablet (160 mg total) by mouth daily. 90 tablet 0   metoprolol  succinate (TOPROL -XL) 100 MG 24 hr tablet Take 1 tablet (100 mg total) by mouth daily. Take with or immediately following a meal. 90 tablet 0   sacubitril -valsartan  (ENTRESTO ) 49-51 MG Take 1 tablet by mouth 2 (two) times daily. 180 tablet 3   spironolactone  (ALDACTONE ) 25 MG tablet Take 0.5 tablets (12.5 mg total) by mouth daily. 90 tablet 0   bumetanide  (BUMEX ) 1 MG tablet Take 1 tablet (1 mg total) by mouth daily. 90 tablet 3   No current facility-administered medications on file prior to visit.     Allergies  Allergen Reactions   Lisinopril     Cough      Review of Systems ROS Review of Systems - General ROS: negative for - chills, fatigue, fever, hot flashes, night sweats, weight gain or weight loss Psychological ROS: negative for - anxiety, decreased libido, depression, mood swings, physical abuse or sexual abuse Ophthalmic ROS: negative for - blurry vision, eye pain or loss of vision ENT ROS: negative for - headaches, hearing change, visual changes or vocal changes Allergy and Immunology ROS: negative for - hives, itchy/watery eyes or seasonal allergies Hematological and Lymphatic ROS: negative for - bleeding problems, bruising, swollen lymph nodes or weight loss Endocrine ROS: negative for - galactorrhea, hair pattern changes,  hot flashes, malaise/lethargy, mood swings, palpitations, polydipsia/polyuria, skin changes, temperature intolerance or unexpected weight changes Breast ROS: negative for - new or changing breast lumps or nipple discharge Respiratory ROS: negative for - cough or shortness of breath Cardiovascular ROS: negative for - chest pain, irregular heartbeat, palpitations or shortness of breath Gastrointestinal ROS: no abdominal pain, change in bowel habits, or black or bloody stools Genito-Urinary ROS: no dysuria, trouble voiding, or hematuria Musculoskeletal ROS: negative for - joint pain or joint stiffness Neurological ROS: negative for - bowel and bladder control changes Dermatological ROS: negative for rash and skin lesion changes  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   Objective:   BP 136/61   Pulse (!) 56   Ht 5' (1.524 m)   Wt 257 lb (116.6 kg)   LMP 11/03/2013   BMI 50.19 kg/m  CONSTITUTIONAL: Well-developed, well-nourished female in no acute distress.  PSYCHIATRIC: Normal mood and affect. Normal behavior. Normal judgment and thought content. NEUROLGIC: Alert and oriented to person, place, and time. Normal muscle  tone coordination. No cranial nerve deficit noted. HENT:  Normocephalic, atraumatic, External right and left ear normal. Oropharynx is clear and moist EYES: Conjunctivae and EOM are normal. Pupils are equal, round, and reactive to light. No scleral icterus.  NECK: Normal range of motion, supple, no masses.  Normal thyroid .  SKIN: Skin is warm and dry. No rash noted. Not diaphoretic. No erythema. No pallor. CARDIOVASCULAR: Normal heart rate noted, regular rhythm, no murmur. RESPIRATORY: Clear to auscultation bilaterally. Effort and breath sounds normal, no problems with respiration noted. BREASTS: Symmetric in size. No masses, skin changes, nipple drainage, or lymphadenopathy. ABDOMEN: Soft, normal bowel sounds, no distention noted.  No tenderness, rebound or guarding.  BLADDER: Normal EG- normal with skin changes c/w constant urine exposure Pederson speculum used as she could not tolerate anything larger. Cervical mass noted. It was quite friable, bleeding easily when touched. I wanted to biopsy it but was concerned about hemorrhage.   MUSCULOSKELETAL: Normal range of motion. No tenderness.  No cyanosis, clubbing, or edema.  2+ distal pulses. LYMPHATIC: No Axillary, Supraclavicular, or Inguinal Adenopathy.   Labs: Lab Results  Component Value Date   WBC 7.9 04/06/2024   HGB 13.4 04/06/2024   HCT 41.8 04/06/2024   MCV 93.3 04/06/2024   PLT 386 04/06/2024    Lab Results  Component Value Date   CREATININE 1.12 (H) 04/06/2024   BUN 28 (H) 04/06/2024   NA 139 04/06/2024   K 3.7 04/06/2024   CL 104 04/06/2024   CO2 26 04/06/2024    Lab Results  Component Value Date   ALT 27 04/06/2024   AST 30 04/06/2024   ALKPHOS 35 (L) 04/06/2024   BILITOT 0.7 04/06/2024    Lab Results  Component Value Date   CHOL 149 06/30/2023   HDL 40 06/30/2023   LDLCALC 80 06/30/2023   TRIG 172 (H) 06/30/2023   CHOLHDL 3.7 06/30/2023    Lab Results  Component Value Date   TSH 3.880  06/30/2023    Lab Results  Component Value Date   HGBA1C 6.2 (H) 06/30/2023     Assessment:   1. PMB (postmenopausal bleeding)   2. Thickened endometrium   3.      Well woman exam 4.      Cervical mass Plan:  Pap: done today Mammogram ordered Referral made to gyn onc (urgent) Bleeding precautions given   Jeffrie Lofstrom C Stephane Junkins, MD [facility]

## 2024-05-31 LAB — CYTOLOGY - PAP
Comment: NEGATIVE
High risk HPV: NEGATIVE

## 2024-06-01 ENCOUNTER — Inpatient Hospital Stay: Payer: PRIVATE HEALTH INSURANCE | Attending: Obstetrics and Gynecology | Admitting: Obstetrics and Gynecology

## 2024-06-01 ENCOUNTER — Telehealth: Payer: Self-pay | Admitting: Obstetrics and Gynecology

## 2024-06-01 ENCOUNTER — Encounter: Payer: Self-pay | Admitting: Obstetrics and Gynecology

## 2024-06-01 ENCOUNTER — Other Ambulatory Visit: Payer: Self-pay | Admitting: Nurse Practitioner

## 2024-06-01 ENCOUNTER — Inpatient Hospital Stay: Payer: PRIVATE HEALTH INSURANCE

## 2024-06-01 VITALS — BP 156/62 | HR 53 | Temp 98.6°F | Resp 18 | Wt 257.7 lb

## 2024-06-01 DIAGNOSIS — D390 Neoplasm of uncertain behavior of uterus: Secondary | ICD-10-CM | POA: Insufficient documentation

## 2024-06-01 DIAGNOSIS — I4891 Unspecified atrial fibrillation: Secondary | ICD-10-CM | POA: Diagnosis not present

## 2024-06-01 DIAGNOSIS — M109 Gout, unspecified: Secondary | ICD-10-CM | POA: Diagnosis not present

## 2024-06-01 DIAGNOSIS — C541 Malignant neoplasm of endometrium: Secondary | ICD-10-CM | POA: Diagnosis present

## 2024-06-01 DIAGNOSIS — I340 Nonrheumatic mitral (valve) insufficiency: Secondary | ICD-10-CM | POA: Insufficient documentation

## 2024-06-01 DIAGNOSIS — N950 Postmenopausal bleeding: Secondary | ICD-10-CM | POA: Diagnosis not present

## 2024-06-01 DIAGNOSIS — I2510 Atherosclerotic heart disease of native coronary artery without angina pectoris: Secondary | ICD-10-CM | POA: Insufficient documentation

## 2024-06-01 DIAGNOSIS — I5032 Chronic diastolic (congestive) heart failure: Secondary | ICD-10-CM | POA: Diagnosis not present

## 2024-06-01 DIAGNOSIS — Z6841 Body Mass Index (BMI) 40.0 and over, adult: Secondary | ICD-10-CM | POA: Insufficient documentation

## 2024-06-01 DIAGNOSIS — N888 Other specified noninflammatory disorders of cervix uteri: Secondary | ICD-10-CM | POA: Diagnosis not present

## 2024-06-01 DIAGNOSIS — R599 Enlarged lymph nodes, unspecified: Secondary | ICD-10-CM

## 2024-06-01 DIAGNOSIS — I110 Hypertensive heart disease with heart failure: Secondary | ICD-10-CM | POA: Diagnosis not present

## 2024-06-01 MED ORDER — NYSTATIN 100000 UNIT/GM EX POWD
1.0000 | Freq: Three times a day (TID) | CUTANEOUS | 1 refills | Status: DC
Start: 2024-06-01 — End: 2024-06-01

## 2024-06-01 MED ORDER — NYSTATIN 100000 UNIT/GM EX POWD: 1.0000 | Freq: Three times a day (TID) | CUTANEOUS | 1 refills | Status: AC

## 2024-06-01 MED ORDER — TRAMADOL HCL 50 MG PO TABS: 50.0000 mg | ORAL_TABLET | Freq: Four times a day (QID) | ORAL | 0 refills | Status: DC | PRN

## 2024-06-01 NOTE — Progress Notes (Signed)
 Gynecologic Oncology Consult Visit   Referring Provider: Dr. Starla  Chief Complaint: PMB  Subjective:  Tammy Chavez is a 63 y.o. G0P0 female who is seen in consultation from Dr. Starla for postmenopausal bleeding.  Patient noticed scant spotting in June 2025.  She was seen in the ER and ultrasound at that time showed thickened endometrium measuring 8.8 mm and small fibroid.  Patient was seen by Dr. Starla on 05/24/2024.  Pap was obtained.  Patient had poor tolerance to pelvic exam and therefore, endometrial biopsy was not obtained.  However, on exam, Dr. Samul noted a cervical mass, friable.  Due to concerns of hemorrhage, biopsy was not obtained.  05/24/2024- Pap-atypical glandular cells favoring neoplastic, favoring adenocarcinoma of endometrial origin.  High risk HPV was negative.  Patient presents today for further workup and management.   She denies unintentional weight loss, shortness of breath. Endorses some left sided pelvic cramping. Denies changes in bowel movements or urination.   Problem List: Patient Active Problem List   Diagnosis Date Noted   Abnormal uterine bleeding (AUB) 04/04/2024   Screening for colon cancer 06/30/2023   Screening mammogram for breast cancer 06/30/2023   Atrial fibrillation (HCC) 06/30/2023   Prediabetes 06/30/2023   Routine general medical examination at a health care facility 06/27/2023   Chronic cough 05/26/2023   Cardiomegaly 05/26/2023   Pleural effusion on right 05/26/2023   Mixed hyperlipidemia 01/09/2023   Adult body mass index 50.0-59.9 (HCC) 01/09/2023   Hypokalemia 10/23/2014   Peripheral edema 10/23/2014   Obesity 10/23/2014   Primary hypertension 07/25/2011   Gout 07/25/2011   Hyperlipidemia 04/04/2010   Anemia 04/04/2010   Past Medical History: Past Medical History:  Diagnosis Date   Atrial fibrillation (HCC)    CAD (coronary artery disease)    Chronic diastolic heart failure (HCC)    Gout    Grade II diastolic dysfunction     Heel spur    Left   Hyperlipidemia    Hypertension    Mild mitral regurgitation by prior echocardiogram    Mild mitral regurgitation by prior echocardiogram    Uncontrolled hypertension    Past Surgical History: Past Surgical History:  Procedure Laterality Date   ACHILLES TENDON REPAIR     CATARACT EXTRACTION W/PHACO Left 05/18/2024   Procedure: PHACOEMULSIFICATION, CATARACT, WITH IOL INSERTION 9.73, 00:46.3;  Surgeon: Mittie Gaskin, MD;  Location: [facility];  Service: Ophthalmology;  Laterality: Left;   RIGHT/LEFT HEART CATH AND CORONARY ANGIOGRAPHY Bilateral 08/26/2023   Procedure: RIGHT/LEFT HEART CATH AND CORONARY ANGIOGRAPHY;  Surgeon: Rolan Ezra RAMAN, MD;  Location: [facility];  Service: Cardiovascular;  Laterality: Bilateral;   Past Gynecologic History:  Post menopausal x 25 years Never sexually active.  G0P0  OB History:  OB History  No obstetric history on file.   Family History: Family History  Problem Relation Age of Onset   Heart disease Mother    Diabetes Sister    Diabetes Sister    Hyperlipidemia Brother    Hyperlipidemia Brother    Hyperlipidemia Brother    Bladder Cancer Maternal Uncle    Social History: Social History   Socioeconomic History   Marital status: Single    Spouse name: Not on file   Number of children: Not on file   Years of education: Not on file   Highest education level: Associate degree: occupational, Scientist, product/process development, or vocational program  Occupational History   Not on file  Tobacco Use   Smoking status: Never  Smokeless tobacco: Never  Vaping Use   Vaping status: Never Used  Substance and Sexual Activity   Alcohol use: No   Drug use: No   Sexual activity: Not Currently  Other Topics Concern   Not on file  Social History Narrative   Lives with [name], sister.    Social Drivers of Health   Financial Resource Strain: Medium Risk (03/07/2024)   Overall Financial Resource Strain (CARDIA)     Difficulty of Paying Living Expenses: Somewhat hard  Food Insecurity: No Food Insecurity (06/01/2024)   Hunger Vital Sign    Worried About Running Out of Food in the Last Year: Never true    Ran Out of Food in the Last Year: Never true  Recent Concern: Food Insecurity - Food Insecurity Present (03/07/2024)   Hunger Vital Sign    Worried About Running Out of Food in the Last Year: Sometimes true    Ran Out of Food in the Last Year: Sometimes true  Transportation Needs: No Transportation Needs (06/01/2024)   PRAPARE - Administrator, Civil Service (Medical): No    Lack of Transportation (Non-Medical): No  Physical Activity: Insufficiently Active (03/07/2024)   Exercise Vital Sign    Days of Exercise per Week: 2 days    Minutes of Exercise per Session: 30 min  Stress: No Stress Concern Present (03/07/2024)   [employer] of Occupational Health - Occupational Stress Questionnaire    Feeling of Stress : Only a little  Social Connections: Unknown (03/07/2024)   Social Connection and Isolation Panel    Frequency of Communication with Friends and Family: More than three times a week    Frequency of Social Gatherings with Friends and Family: Once a week    Attends Religious Services: Patient declined    Database administrator or Organizations: No    Attends Engineer, structural: Not on file    Marital Status: Never married  Intimate Partner Violence: Not At Risk (06/01/2024)   Humiliation, Afraid, Rape, and Kick questionnaire    Fear of Current or Ex-Partner: No    Emotionally Abused: No    Physically Abused: No    Sexually Abused: No  Has 8 siblings.   Allergies: Allergies  Allergen Reactions   Lisinopril     Cough   Current Medications: Current Outpatient Medications  Medication Sig Dispense Refill   albuterol  (VENTOLIN  HFA) 108 (90 Base) MCG/ACT inhaler Inhale 2 puffs into the lungs every 6 (six) hours as needed for wheezing or shortness of breath. 8 g 2    allopurinol  (ZYLOPRIM ) 300 MG tablet Take 1 tablet (300 mg total) by mouth daily. 90 tablet 3   apixaban  (ELIQUIS ) 5 MG TABS tablet Take 1 tablet (5 mg total) by mouth 2 (two) times daily. 180 tablet 3   atorvastatin  (LIPITOR) 80 MG tablet Take 1 tablet (80 mg total) by mouth daily. 90 tablet 0   Cyanocobalamin 5000 MCG TBDP Take 5,000 mcg by mouth daily.     ezetimibe  (ZETIA ) 10 MG tablet Take 10 mg by mouth daily.     FARXIGA  10 MG TABS tablet Take 1 tablet (10 mg total) by mouth daily before breakfast. NEEDS FOLLOW UP APPOINTMENT FOR MORE REFILLS 90 tablet 3   fenofibrate  160 MG tablet Take 1 tablet (160 mg total) by mouth daily. 90 tablet 0   metoprolol  succinate (TOPROL -XL) 100 MG 24 hr tablet Take 1 tablet (100 mg total) by mouth daily. Take with or immediately following a  meal. 90 tablet 0   sacubitril -valsartan  (ENTRESTO ) 49-51 MG Take 1 tablet by mouth 2 (two) times daily. 180 tablet 3   spironolactone  (ALDACTONE ) 25 MG tablet Take 0.5 tablets (12.5 mg total) by mouth daily. 90 tablet 0   bumetanide  (BUMEX ) 1 MG tablet Take 1 tablet (1 mg total) by mouth daily. 90 tablet 3   No current facility-administered medications for this visit.   Review of System General: negative for fevers, changes in weight or night sweats Skin: negative for changes in moles or sores or rash Eyes: negative for changes in vision HEENT: negative for change in hearing, tinnitus, voice changes Pulmonary: negative for dyspnea, orthopnea, productive cough, wheezing Cardiac: negative for palpitations, pain Gastrointestinal: negative for nausea, vomiting, constipation, diarrhea, hematemesis, hematochezia Genitourinary/Sexual: negative for dysuria, retention, hematuria, incontinence Ob/Gyn:  positive for spotting & pain Musculoskeletal: negative for pain, joint pain, back pain Hematology: negative for easy bruising, abnormal bleeding Neurologic/Psych: negative for headaches, seizures, paralysis, weakness,  numbness   Objective:  Physical Examination:  BP (!) 156/62   Pulse (!) 53   Temp 98.6 F (37 C)   Resp 18   Wt 257 lb 11.2 oz (116.9 kg)   LMP 11/03/2013   SpO2 99%   BMI 50.33 kg/m     ECOG Performance Status: 1 - Symptomatic but completely ambulatory  GENERAL: Patient is a well appearing female in no acute distress HEENT:  Sclerae anicteric.  Oropharynx clear and moist. No ulcerations or evidence of oropharyngeal candidiasis. Neck is supple.  NODES:  No cervical, supraclavicular, or axillary lymphadenopathy palpated. There is a palpable inguinal node 1.5-2 cm on the right. No palpable nodes on the left.  LUNGS: diminished breath sounds on right HEART:  irregular rhythm  ABDOMEN:  Soft, nontender. Exam limited d/t habitus.  MSK:  No focal spinal tenderness to palpation. Full range of motion bilaterally in the upper extremities. EXTREMITIES:  bilateral peripheral edema 1+ SKIN:  Clear with no obvious rashes or skin changes.  NEURO:  Nonfocal. Well oriented.  Appropriate affect.  Pelvic: Exam chaperoned by NP.  Patient was very worried about the pelvic exam.  We utilized topical lidocaine  gel on her perineum and introitus which helped with the exam EGBUS: no lesions Cervix: 2-3 prolapsed mass through the os, bleeding and friable Vagina: no lesions, no discharge or bleeding Uterus: Unable to determine due to patient habitus and inability to tolerate exam. Adnexa: no palpable masses but very limited exam. Rectovaginal: Not performed  PROCEDURE: The risks and benefits of the procedure were reviewed and informed consent obtained. Time out was performed. The patient received pre-procedure teaching and expressed understanding. The post-procedure instructions were reviewed with the patient and she expressed understanding. The patient does not have any barriers to learning.  The cervix was bathed in Hibiclens.  Attempt was made for an endometrial biopsy however that could not be  performed.  The cervical mass was then biopsied x 2.  Procedure completed without complications. Hemostasis adequate with Monsel's solution. The patient tolerated the procedure well.   Post-procedure evaluation the patient was stable without complaints.   Lab Review Lab Results  Component Value Date   WBC 7.9 04/06/2024   HGB 13.4 04/06/2024   HCT 41.8 04/06/2024   MCV 93.3 04/06/2024   PLT 386 04/06/2024     Chemistry      Component Value Date/Time   NA 139 04/06/2024 1017   NA 143 03/30/2024 1614   K 3.7 04/06/2024 1017   CL  104 04/06/2024 1017   CO2 26 04/06/2024 1017   BUN 28 (H) 04/06/2024 1017   BUN 29 (H) 03/30/2024 1614   CREATININE 1.12 (H) 04/06/2024 1017   CREATININE 0.98 03/24/2013 1008      Component Value Date/Time   CALCIUM  9.4 04/06/2024 1017   ALKPHOS 35 (L) 04/06/2024 1017   AST 30 04/06/2024 1017   ALT 27 04/06/2024 1017   BILITOT 0.7 04/06/2024 1017   BILITOT 0.3 06/30/2023 1242        Radiologic Imaging: Per HPI    Assessment:  Tammy Chavez is a 63 y.o. female with prolapsing cervical mass concerning for malignancy.  Medical co-morbidities complicating care:  Atrial fibrillation (HCC)  CAD (coronary artery disease)  Chronic diastolic heart failure (HCC)  Gout  Grade II diastolic dysfunction  Body mass index is 50.33 kg/m. Mild mitral regurgitation by prior echocardiogram  Uncontrolled hypertension    Plan:   Problem List Items Addressed This Visit   None Visit Diagnoses       Post-menopausal bleeding    -  Primary     Cervical mass          We discussed options that we have concerned she may have a malignancy.  The pathology results will be sent and we have ordered a CT scan of the chest abdomen pelvis.  She will return to clinic to review all these results and we will proceed accordingly based on the findings.  The patient's diagnosis, an outline of the further diagnostic and laboratory studies which will be required, the  recommendation for surgery, and alternatives were discussed with her and her accompanying family members.  All questions were answered to their satisfaction.  Tinnie Dawn, DNP, AGNP-C, AOCNP [facility] [phone] (clinic)  I personally had a face to face interaction and evaluated the patient jointly with the NP, Ms. Tinnie Dawn.  I have reviewed her history and available records and have performed the key portions of the physical exam including lymph node survey, abdominal exam, pelvic exam with my findings confirming those documented above by the APP.  I have discussed the case with the APP and the patient.  I also performed the cervical biopsies. I agree with the above documentation, assessment and plan which was fully formulated by me.  Counseling was completed by me.    Donavon Kimrey Isidor Constable, MD   CC:  Starla Harland BROCKS, MD [address] [place],  [place] [address] [phone]

## 2024-06-01 NOTE — Telephone Encounter (Signed)
 Called pt to let her know that CT has been scheduled for Tammy Chavez on Friday - gave her appt details. - LH

## 2024-06-03 ENCOUNTER — Ambulatory Visit (HOSPITAL_COMMUNITY)
Admission: RE | Admit: 2024-06-03 | Discharge: 2024-06-03 | Disposition: A | Discharge status: Home / Self Care | Payer: PRIVATE HEALTH INSURANCE | Source: Ambulatory Visit | Attending: Nurse Practitioner | Admitting: Nurse Practitioner

## 2024-06-03 ENCOUNTER — Other Ambulatory Visit: Payer: Self-pay | Admitting: Nurse Practitioner

## 2024-06-03 DIAGNOSIS — N888 Other specified noninflammatory disorders of cervix uteri: Secondary | ICD-10-CM | POA: Diagnosis present

## 2024-06-03 DIAGNOSIS — N950 Postmenopausal bleeding: Secondary | ICD-10-CM | POA: Insufficient documentation

## 2024-06-03 DIAGNOSIS — R599 Enlarged lymph nodes, unspecified: Secondary | ICD-10-CM | POA: Insufficient documentation

## 2024-06-03 DIAGNOSIS — E782 Mixed hyperlipidemia: Secondary | ICD-10-CM

## 2024-06-03 MED ORDER — IOHEXOL 350 MG/ML SOLN
80.0000 mL | Freq: Once | INTRAVENOUS | Status: AC | PRN
  Administered 2024-06-03: 80 mL via INTRAVENOUS

## 2024-06-06 ENCOUNTER — Telehealth: Payer: Self-pay | Admitting: Nurse Practitioner

## 2024-06-06 DIAGNOSIS — C541 Malignant neoplasm of endometrium: Secondary | ICD-10-CM

## 2024-06-06 LAB — SURGICAL PATHOLOGY

## 2024-06-06 NOTE — Telephone Encounter (Signed)
 Spoke to patient by phone. Reviewed pathology briefly that is consistent with endometrial adenocarcinoma. Next step is to complete imaging and see her back later this week to complete management plan. Emotional support provided.

## 2024-06-06 NOTE — Telephone Encounter (Signed)
 Called patient to discuss pathology. No answer. Left vm to return call.

## 2024-06-07 ENCOUNTER — Telehealth: Payer: Self-pay

## 2024-06-07 NOTE — Telephone Encounter (Signed)
 [id number] MMR deficient. MLH1 promotor methylation ordered.

## 2024-06-07 NOTE — Telephone Encounter (Signed)
 Radiology called for CT read prior to appointment 8/13.

## 2024-06-08 ENCOUNTER — Inpatient Hospital Stay (HOSPITAL_BASED_OUTPATIENT_CLINIC_OR_DEPARTMENT_OTHER): Payer: PRIVATE HEALTH INSURANCE | Admitting: Obstetrics and Gynecology

## 2024-06-08 VITALS — BP 143/52 | HR 57 | Temp 98.6°F | Resp 20 | Wt 257.2 lb

## 2024-06-08 DIAGNOSIS — C541 Malignant neoplasm of endometrium: Secondary | ICD-10-CM | POA: Diagnosis not present

## 2024-06-08 DIAGNOSIS — N939 Abnormal uterine and vaginal bleeding, unspecified: Secondary | ICD-10-CM | POA: Diagnosis not present

## 2024-06-08 NOTE — Progress Notes (Signed)
 Gynecologic Oncology Consult Visit   Referring Provider: Dr. Starla  Chief Complaint: Endometrioid cancer in mass prolapsing through cervix.   Subjective:  Tammy Chavez is a 63 y.o. G0P0 female who is seen in consultation from Dr. Herold for postmenopausal bleeding.  Patient noticed scant spotting in June 2025.  She was seen in the ER and ultrasound at that time showed thickened endometrium measuring 8.8 mm and small fibroid.  Patient was seen by Dr. Starla on 05/24/2024.  Pap was obtained.  Patient had poor tolerance to pelvic exam and therefore, endometrial biopsy was not obtained.  However, on exam, Dr. Samul noted a cervical mass, friable.  Due to concerns of hemorrhage, biopsy was not obtained.  05/24/2024- Pap-atypical glandular cells favoring neoplastic, favoring adenocarcinoma of endometrial origin.  High risk HPV was negative.  Patient seen last week by Gyn onc for further workup and management. Biopsy shows grade 1 endometrioid cancer. Most likely this is a primary uterine cancer.   Loss of MLH1 and PMS2 with intact MSH6 and MSH2.  MLH1 promoter methylation pending.  TP53 WT  She denies unintentional weight loss, shortness of breath. Endorses some left sided pelvic cramping. Denies changes in bowel movements or urination.  She has light bleeding.  Problem List: Patient Active Problem List   Diagnosis Date Noted   Abnormal uterine bleeding (AUB) 04/04/2024   Screening for colon cancer 06/30/2023   Screening mammogram for breast cancer 06/30/2023   Atrial fibrillation (HCC) 06/30/2023   Prediabetes 06/30/2023   Routine general medical examination at a health care facility 06/27/2023   Chronic cough 05/26/2023   Cardiomegaly 05/26/2023   Pleural effusion on right 05/26/2023   Mixed hyperlipidemia 01/09/2023   Adult body mass index 50.0-59.9 (HCC) 01/09/2023   Hypokalemia 10/23/2014   Peripheral edema 10/23/2014   Obesity 10/23/2014   Primary hypertension 07/25/2011   Gout  07/25/2011   Hyperlipidemia 04/04/2010   Anemia 04/04/2010   Past Medical History: Past Medical History:  Diagnosis Date   Atrial fibrillation (HCC)    CAD (coronary artery disease)    Chronic diastolic heart failure (HCC)    Gout    Grade II diastolic dysfunction    Heel spur    Left   Hyperlipidemia    Hypertension    Mild mitral regurgitation by prior echocardiogram    Mild mitral regurgitation by prior echocardiogram    Uncontrolled hypertension    Past Surgical History: Past Surgical History:  Procedure Laterality Date   ACHILLES TENDON REPAIR     CATARACT EXTRACTION W/PHACO Left 05/18/2024   Procedure: PHACOEMULSIFICATION, CATARACT, WITH IOL INSERTION 9.73, 00:46.3;  Surgeon: Mittie Gaskin, MD;  Location: [facility];  Service: Ophthalmology;  Laterality: Left;   RIGHT/LEFT HEART CATH AND CORONARY ANGIOGRAPHY Bilateral 08/26/2023   Procedure: RIGHT/LEFT HEART CATH AND CORONARY ANGIOGRAPHY;  Surgeon: Rolan Ezra RAMAN, MD;  Location: [facility];  Service: Cardiovascular;  Laterality: Bilateral;   Past Gynecologic History:  Post menopausal x 25 years Never sexually active.  G0P0  OB History:  OB History  No obstetric history on file.   Family History: Family History  Problem Relation Age of Onset   Heart disease Mother    Diabetes Sister    Diabetes Sister    Hyperlipidemia Brother    Hyperlipidemia Brother    Hyperlipidemia Brother    Bladder Cancer Maternal Uncle    Social History: Social History   Socioeconomic History   Marital status: Single    Spouse name:  Not on file   Number of children: Not on file   Years of education: Not on file   Highest education level: Associate degree: occupational, Scientist, product/process development, or vocational program  Occupational History   Not on file  Tobacco Use   Smoking status: Never   Smokeless tobacco: Never  Vaping Use   Vaping status: Never Used  Substance and Sexual Activity   Alcohol use: No   Drug  use: No   Sexual activity: Not Currently  Other Topics Concern   Not on file  Social History Narrative   Lives with [name], sister.    Social Drivers of Health   Financial Resource Strain: Medium Risk (03/07/2024)   Overall Financial Resource Strain (CARDIA)    Difficulty of Paying Living Expenses: Somewhat hard  Food Insecurity: No Food Insecurity (06/01/2024)   Hunger Vital Sign    Worried About Running Out of Food in the Last Year: Never true    Ran Out of Food in the Last Year: Never true  Recent Concern: Food Insecurity - Food Insecurity Present (03/07/2024)   Hunger Vital Sign    Worried About Running Out of Food in the Last Year: Sometimes true    Ran Out of Food in the Last Year: Sometimes true  Transportation Needs: No Transportation Needs (06/01/2024)   PRAPARE - Administrator, Civil Service (Medical): No    Lack of Transportation (Non-Medical): No  Physical Activity: Insufficiently Active (03/07/2024)   Exercise Vital Sign    Days of Exercise per Week: 2 days    Minutes of Exercise per Session: 30 min  Stress: No Stress Concern Present (03/07/2024)   [employer] of Occupational Health - Occupational Stress Questionnaire    Feeling of Stress : Only a little  Social Connections: Unknown (03/07/2024)   Social Connection and Isolation Panel    Frequency of Communication with Friends and Family: More than three times a week    Frequency of Social Gatherings with Friends and Family: Once a week    Attends Religious Services: Patient declined    Database administrator or Organizations: No    Attends Engineer, structural: Not on file    Marital Status: Never married  Intimate Partner Violence: Not At Risk (06/01/2024)   Humiliation, Afraid, Rape, and Kick questionnaire    Fear of Current or Ex-Partner: No    Emotionally Abused: No    Physically Abused: No    Sexually Abused: No  Has 8 siblings.   Allergies: Allergies  Allergen Reactions    Lisinopril     Cough   Current Medications: Current Outpatient Medications  Medication Sig Dispense Refill   albuterol  (VENTOLIN  HFA) 108 (90 Base) MCG/ACT inhaler Inhale 2 puffs into the lungs every 6 (six) hours as needed for wheezing or shortness of breath. 8 g 2   allopurinol  (ZYLOPRIM ) 300 MG tablet Take 1 tablet (300 mg total) by mouth daily. 90 tablet 3   apixaban  (ELIQUIS ) 5 MG TABS tablet Take 1 tablet (5 mg total) by mouth 2 (two) times daily. 180 tablet 3   atorvastatin  (LIPITOR) 80 MG tablet Take 1 tablet (80 mg total) by mouth daily. 90 tablet 0   Cyanocobalamin 5000 MCG TBDP Take 5,000 mcg by mouth daily.     ezetimibe  (ZETIA ) 10 MG tablet Take 10 mg by mouth daily.     FARXIGA  10 MG TABS tablet Take 1 tablet (10 mg total) by mouth daily before breakfast. NEEDS FOLLOW UP APPOINTMENT  FOR MORE REFILLS 90 tablet 3   fenofibrate  160 MG tablet Take 1 tablet (160 mg total) by mouth daily. 90 tablet 0   metoprolol  succinate (TOPROL -XL) 100 MG 24 hr tablet Take 1 tablet (100 mg total) by mouth daily. Take with or immediately following a meal. 90 tablet 0   nystatin  (MYCOSTATIN /NYSTOP ) powder Apply 1 Application topically 3 (three) times daily. To inguinal skin folds as needed 60 g 1   sacubitril -valsartan  (ENTRESTO ) 49-51 MG Take 1 tablet by mouth 2 (two) times daily. 180 tablet 3   spironolactone  (ALDACTONE ) 25 MG tablet Take 0.5 tablets (12.5 mg total) by mouth daily. 90 tablet 0   traMADol  (ULTRAM ) 50 MG tablet Take 1 tablet (50 mg total) by mouth every 6 (six) hours as needed. 10 tablet 0   No current facility-administered medications for this visit.   Review of System General: negative for fevers, changes in weight or night sweats Skin: negative for changes in moles or sores or rash Eyes: negative for changes in vision HEENT: negative for change in hearing, tinnitus, voice changes Pulmonary: negative for dyspnea, orthopnea, productive cough, wheezing Cardiac: negative for  palpitations, pain Gastrointestinal: negative for nausea, vomiting, constipation, diarrhea, hematemesis, hematochezia Genitourinary/Sexual: negative for dysuria, retention, hematuria, incontinence Ob/Gyn:  positive for spotting & pain Musculoskeletal: negative for pain, joint pain, back pain Hematology: negative for easy bruising, abnormal bleeding Neurologic/Psych: negative for headaches, seizures, paralysis, weakness, numbness   Objective:  Physical Examination:  LMP 11/03/2013     ECOG Performance Status: 1 - Symptomatic but completely ambulatory  GENERAL: Patient is a well appearing female in no acute distress HEENT:  Sclerae anicteric.  Oropharynx clear and moist. No ulcerations or evidence of oropharyngeal candidiasis. Neck is supple.  NODES:  No cervical, supraclavicular, or axillary lymphadenopathy palpated. There is a palpable inguinal node 1.5-2 cm on the right. No palpable nodes on the left.  LUNGS: diminished breath sounds on right HEART:  irregular rhythm  ABDOMEN:  Soft, nontender. Exam limited d/t habitus.  MSK:  No focal spinal tenderness to palpation. Full range of motion bilaterally in the upper extremities. EXTREMITIES:  bilateral peripheral edema 1+ SKIN:  Clear with no obvious rashes or skin changes.  NEURO:  Nonfocal. Well oriented.  Appropriate affect.  Pelvic: Exam chaperoned by NP.  Patient was very worried about the pelvic exam.  We utilized topical lidocaine  gel on her perineum and introitus which helped with the exam EGBUS: no lesions Cervix: 2-3 prolapsed mass through the os, bleeding and friable Vagina: no lesions, no discharge or bleeding Uterus: Unable to determine due to patient habitus and inability to tolerate exam. Adnexa: no palpable masses but very limited exam. Rectovaginal: Not performed    Lab Review Lab Results  Component Value Date   WBC 7.9 04/06/2024   HGB 13.4 04/06/2024   HCT 41.8 04/06/2024   MCV 93.3 04/06/2024   PLT 386  04/06/2024     Chemistry      Component Value Date/Time   NA 139 04/06/2024 1017   NA 143 03/30/2024 1614   K 3.7 04/06/2024 1017   CL 104 04/06/2024 1017   CO2 26 04/06/2024 1017   BUN 28 (H) 04/06/2024 1017   BUN 29 (H) 03/30/2024 1614   CREATININE 1.12 (H) 04/06/2024 1017   CREATININE 0.98 03/24/2013 1008      Component Value Date/Time   CALCIUM  9.4 04/06/2024 1017   ALKPHOS 35 (L) 04/06/2024 1017   AST 30 04/06/2024 1017   ALT  27 04/06/2024 1017   BILITOT 0.7 04/06/2024 1017   BILITOT 0.3 06/30/2023 1242       Radiologic Imaging: CT scan 06/03/24  CHEST FINDINGS Cardiovascular: Aortic atherosclerosis. Mild cardiomegaly. Three-vessel coronary artery calcifications. No pericardial effusion.   Mediastinum/Nodes: No enlarged mediastinal, hilar, or axillary lymph nodes. Thyroid  gland, trachea, and esophagus demonstrate no significant findings.   Lungs/Pleura: Lungs are clear. No pleural effusion or pneumothorax.   Musculoskeletal: No chest wall abnormality. No acute osseous findings.   CT ABDOMEN PELVIS FINDINGS   Hepatobiliary: No solid liver abnormality is seen. Hepatomegaly, maximum coronal span 21.4 cm. No gallstones, gallbladder wall thickening, or biliary dilatation.   Pancreas: Unremarkable. No pancreatic ductal dilatation or surrounding inflammatory changes.   Spleen: Normal in size without significant abnormality.   Adrenals/Urinary Tract: Adrenal glands are unremarkable. Small nonobstructive calculus of the superior pole of the left kidney. No right-sided calculi, ureteral calculi, or hydronephrosis. Bladder is unremarkable.   Stomach/Bowel: Stomach is within normal limits. Appendix appears normal. No evidence of bowel wall thickening, distention, or inflammatory changes.   Vascular/Lymphatic: Aortic atherosclerosis. No enlarged abdominal or pelvic lymph nodes.   Reproductive: Somewhat ill-defined hypodensity of the cervix (series 2, image  100, series 5, image 113). No other abnormality of the uterus. Normal postmenopausal ovaries.   Other: No abdominal wall hernia or abnormality. No ascites.   Musculoskeletal: No acute osseous findings.   IMPRESSION: 1. Somewhat ill-defined hypodensity of the cervix, incompletely characterized. Suspect that these reflect nabothian cysts however in the specifically stated setting of concern for gyn malignancy consider contrast enhanced pelvic MRI to definitively characterize. 2. No evidence of lymphadenopathy or metastatic disease in the chest, abdomen, or pelvis. 3. Hepatomegaly. 4. Nonobstructive left nephrolithiasis.    Assessment:  Tammy Chavez is a 63 y.o. female with prolapsing cervical mass concerning for malignancy. Pathology of biopsy shows grade 1 endometrioid cancer. Most likely this is a primary uterine cancer, although unclear if cervical or uterine on CT scan. No evidence of metastatic disease.  Loss of MLH1 and PMS2 with intact MSH6 and MSH2.  MLH1 promoter methylation pending.  TP53 WT.  Medical co-morbidities complicating care:  Atrial fibrillation (HCC)  CAD (coronary artery disease)  Chronic diastolic heart failure (HCC)  Gout  Grade II diastolic dysfunction  Body mass index is 50.23 kg/m. Mild mitral regurgitation by prior echocardiogram  Uncontrolled hypertension    Plan:   Problem List Items Addressed This Visit       Genitourinary   Abnormal uterine bleeding (AUB) - Primary    Discussed with patient and stepdaughter.  Plan for TLH.BSO and staging and in view of her high BMI and medical comorbidities will do surgery at [facility].  Will arrange for pre op visit at [facility] in the next week with plan for surgery shortly there after with Dr Elby or I if possible.    The patient's diagnosis, an outline of the further diagnostic and laboratory studies which will be required, the recommendation for surgery, and alternatives were discussed with her and her  accompanying family members.  All questions were answered to their satisfaction.   Prentice Agent, MD   CC:  Starla Harland BROCKS, MD [address] [place],  [place] [address] [phone]

## 2024-06-14 ENCOUNTER — Telehealth: Payer: Self-pay

## 2024-06-14 NOTE — Telephone Encounter (Signed)
 Called [facility] for results of MLH1 promotor methylation that was requested on 06/07/24. Reported that it was sent out to [facility] and has not resulted at this time.

## 2024-06-22 ENCOUNTER — Other Ambulatory Visit: Payer: Self-pay | Admitting: Pediatrics

## 2024-06-22 DIAGNOSIS — E782 Mixed hyperlipidemia: Secondary | ICD-10-CM

## 2024-06-22 MED ORDER — ATORVASTATIN CALCIUM 80 MG PO TABS: 80.0000 mg | ORAL_TABLET | Freq: Every day | ORAL | 3 refills | Status: AC

## 2024-06-26 ENCOUNTER — Other Ambulatory Visit: Payer: Self-pay | Admitting: Family

## 2024-06-28 ENCOUNTER — Telehealth: Payer: Self-pay

## 2024-06-28 NOTE — Progress Notes (Signed)
 Attempted to reach pt due to overdue f/u w/ Rolan. Lvm to return call for scheduling. He has availability in afternoon on 9/17.

## 2024-06-29 NOTE — Discharge Summary (Signed)
 Discharge Summary   Patient Name: Tammy Chavez Patient MRN: [id number] Date of Birth: 23-Oct-1961    Facility: [facility] Attending Physician: Prentice Agent, MD   Date of Admission: 06/28/2024 Date of Discharge: 06/29/24    Admission Diagnosis: Grade 1 endometrioid adenocarcinoma  Principal Discharge Diagnosis: Stage 1A likely grade 1 endometrioid adenocarcinoma with minimal superficial myometrial invasion  Secondary Diagnoses: N/A   Procedure During Admission:  Exam under anesthesia Total laparoscopic hysterectomy Bilateral salpingo-oophorectomy Pelvic washings Mini-laparotomy  Reason for Hospitalization Tammy Chavez is a 63 y.o. female with a PMH significant for CAD, atrial fibrillation, CHF, gout, HTN, and grade 1 endometrioid adenocarcinoma of the endometrium who was admitted for continued monitoring for hypoxia s/p the above noted surgery.   Problems Addressed During Hospitalization:  Grade 1 endometrioid adenocarcinoma  Hypoxia in the postoperative setting  Brief Hospital Course by Problem:  #Grade 1 suspected stage 1A endometrioid adenocarcinoma Patient has hx of post-menopausal bleeding. Cervical biopsy on 8/6 showed grade 1 endometrioid adenocarcinoma. Patient is now POD1 from TLH/BSO with frozen path consistent with stage 1A likely grade 1 endometrioid adenocarcinoma with minimal superficial myometrial invasion. Pending final pathology report. She tolerated the surgery without difficulty. Pain well-controlled. Voiding spontaneously and ambulating independently on day of discharge.   #Hypoxia in postoperative setting, resolved Hypoxic (SpO2 83%) in the PACU and unable to ween off O2. CXR (06/28/24) consistent with low lung volumes and opacities, likely secondary to edema. She received one dose of IV lasix  20mg  this morning (9/3). Patient was stable w/ SpO2 ranging from 92% to 97% on room air this AM.  #CHF #HTN #HLD She took metoprolol  XL as recommended on day of  surgery. She was mostly hypertensive. Spironolactone  and metoprolol  were held per post-operative protocol. Continued home atorvastatin  and entresto . She will restart Eliquis  at one week post-op w/out a bridge.  Consults: None  Condition at Discharge: Condition: stable Pain: Controlled with acetaminophen  and dilaudid PRN. Opioids prescribed.  VTE prophylaxis: Will restart Eliquis  at one week post-op; no bridge  Disposition: discharged to home, will follow up in clinic   Physical Exam at Discharge: Temp (24hrs), Avg:36.8 C (98.2 F), Min:36.5 C (97.7 F), Max:37 C (98.6 F)  Temp:  [36.5 C (97.7 F)-37 C (98.6 F)] 36.5 C (97.7 F) Heart Rate:  [54-72] 63 Resp:  [14-20] 20 BP: (129-168)/(51-86) 130/69  General appearance: alert, appears stated age, and cooperative Neurologic: grossly normal  Heart: regular rate Lungs: clear to auscultation bilaterally Abdomen: soft, non-tender; bowel sounds normal; no masses,  no organomegaly Skin: skin color, texture, turgor normal. No rashes or lesions Post-Wounds: clean, dry, intact. Three laparoscopic incisions closed with absorbable suture and skin glue. One mini-laparotomy incision closed with absorbable suture and skin glue and covered with Tegaderm.    Allergies and Medications at Discharge: Allergies  Allergen Reactions  . Lisinopril Cough    Cough      Medication List     PAUSE taking these medications    apixaban  5 mg tablet Wait to take this until: July 05, 2024 Commonly known as: ELIQUIS        START taking these medications    acetaminophen  650 MG ER tablet Commonly known as: TYLENOL  Take 1 tablet (650 mg total) by mouth every 8 (eight) hours for 10 days   ibuprofen 800 MG tablet Commonly known as: MOTRIN Take 1 tablet (800 mg total) by mouth every 8 (eight) hours as needed for Pain for up to 30 doses   oxyCODONE 5 MG immediate  release tablet Commonly known as: ROXICODONE Take 1 tablet (5 mg total) by  mouth every 4 (four) hours as needed for Pain for up to 5 days       CHANGE how you take these medications    metoprolol  SUCCinate 100 MG XL tablet Commonly known as: TOPROL -XL What changed: Another medication with the same name was removed. Continue taking this medication, and follow the directions you see here.       CONTINUE taking these medications    albuterol  MDI (PROVENTIL , VENTOLIN , PROAIR ) HFA 90 mcg/actuation inhaler   allopurinoL  300 MG tablet Commonly known as: ZYLOPRIM    atorvastatin  80 MG tablet Commonly known as: LIPITOR   cyanocobalamin (vitamin B-12) 5,000 mcg Tbdl   ENTRESTO  24-26 mg tablet Generic drug: sacubitriL -valsartan    ezetimibe  10 mg tablet Commonly known as: ZETIA    FARXIGA  10 mg tablet Generic drug: dapagliflozin propanediol    fenofibrate  160 MG tablet   nystatin  100,000 unit/gram powder Commonly known as: MYCOSTATIN    spironolactone  25 MG tablet Commonly known as: ALDACTONE          Where to Get Your Medications     These medications were sent to [facility] [address], [place] - [address]  [address] [place], [place] [address]    Phone: [phone]  acetaminophen  650 MG ER tablet ibuprofen 800 MG tablet oxyCODONE 5 MG immediate release tablet     Follow-up:    [facility] Appointments:   Gyn Oncology Follow up appointment requested with scheduling hub  Outside Providers, with questions related to your patient's hospitalization, please contact the Gyn Oncology PA/Fellow on call  through the [facility] paging operator [phone].   Signed,  TALBERT ARCH, Med Student  Stoney Grist, MD Obstetrics and Gynecology, PGY-1   06/29/2024   BRITTANY Anne DAVIDSON

## 2024-07-01 ENCOUNTER — Ambulatory Visit: Payer: PRIVATE HEALTH INSURANCE | Admitting: Pediatrics

## 2024-07-01 ENCOUNTER — Telehealth: Payer: Self-pay | Admitting: Obstetrics and Gynecology

## 2024-07-01 NOTE — Telephone Encounter (Signed)
 Contacted Ms. Breuer and appointment moved to August 03, 2024

## 2024-07-01 NOTE — Telephone Encounter (Signed)
 Pt stated she just had surgery and wanted to know whether her post op would be at [facility] or [facility]. Pt is currently scheduled for a post op on November 5th. Pt wants to speak with Nurse as to why the post op is that far out.

## 2024-07-11 ENCOUNTER — Other Ambulatory Visit: Payer: Self-pay | Admitting: Nurse Practitioner

## 2024-07-11 DIAGNOSIS — E782 Mixed hyperlipidemia: Secondary | ICD-10-CM

## 2024-07-14 ENCOUNTER — Other Ambulatory Visit: Payer: Self-pay | Admitting: Family

## 2024-07-14 DIAGNOSIS — E782 Mixed hyperlipidemia: Secondary | ICD-10-CM

## 2024-07-15 ENCOUNTER — Ambulatory Visit (INDEPENDENT_AMBULATORY_CARE_PROVIDER_SITE_OTHER): Payer: PRIVATE HEALTH INSURANCE | Admitting: Pediatrics

## 2024-07-15 ENCOUNTER — Encounter: Payer: Self-pay | Admitting: Pediatrics

## 2024-07-15 VITALS — BP 135/82 | HR 62 | Temp 98.2°F | Ht 60.0 in | Wt 253.8 lb

## 2024-07-15 DIAGNOSIS — E782 Mixed hyperlipidemia: Secondary | ICD-10-CM | POA: Diagnosis not present

## 2024-07-15 DIAGNOSIS — Z4889 Encounter for other specified surgical aftercare: Secondary | ICD-10-CM | POA: Diagnosis not present

## 2024-07-15 DIAGNOSIS — R040 Epistaxis: Secondary | ICD-10-CM

## 2024-07-15 MED ORDER — FENOFIBRATE 160 MG PO TABS: 160.0000 mg | ORAL_TABLET | Freq: Every day | ORAL | 3 refills | Status: AC

## 2024-07-15 NOTE — Patient Instructions (Addendum)
 Use afrin spray to stop nose bleed, if still happening call me

## 2024-07-15 NOTE — Progress Notes (Signed)
 Office Visit  BP 135/82 (BP Location: Left Arm, Patient Position: Sitting, Cuff Size: Large)   Pulse 62   Temp 98.2 F (36.8 C) (Oral)   Ht 5' (1.524 m)   Wt 253 lb 12.8 oz (115.1 kg)   LMP 11/03/2013   SpO2 94%   BMI 49.57 kg/m    Subjective:    Patient ID: Tammy Chavez, female    DOB: [birth year], 63 y.o.   MRN: [id number]  HPI: Tammy Chavez is a 63 y.o. female  Chief Complaint  Patient presents with   Epistaxis    Has has 3 nose bleeds this week. Onset this week. Has never happened like this before. Lasting about 10 to 15 minutes each nose bleed. Each time has happened between 630-7pm each evening.    Post-op Follow-up    06/28/2024 full hysterectomy. Still some left abd discomfort. Patient still has sutures, would like those to be checked to make sure they are not infected.     Discussed the use of AI scribe software for clinical note transcription with the patient, who gave verbal consent to proceed.  History of Present Illness   Tammy Chavez is a 63 year old female who presents with post-surgical concerns including nausea, epistaxis, and suture issues.  She has been experiencing mild nausea following her recent surgery.  She is concerned about her sutures, noting some bleeding from the umbilical area. She is unsure if the sutures were supposed to 'file out'.  She has experienced significant epistaxis from the left nostril, occurring around 6:30 to 7:00 PM over the past three days. She is currently on a blood thinner and has never experienced epistaxis prior to this.  She has been using powder between skin folds but has not applied it today due to sweating. She was hesitant to apply it near the sutures.      Relevant past medical, surgical, family and social history reviewed and updated as indicated. Interim medical history since our last visit reviewed. Allergies and medications reviewed and updated.  ROS per HPI unless specifically indicated above      Objective:    BP 135/82 (BP Location: Left Arm, Patient Position: Sitting, Cuff Size: Large)   Pulse 62   Temp 98.2 F (36.8 C) (Oral)   Ht 5' (1.524 m)   Wt 253 lb 12.8 oz (115.1 kg)   LMP 11/03/2013   SpO2 94%   BMI 49.57 kg/m   Wt Readings from Last 3 Encounters:  07/15/24 253 lb 12.8 oz (115.1 kg)  06/08/24 257 lb 3.2 oz (116.7 kg)  06/01/24 257 lb 11.2 oz (116.9 kg)     Physical Exam Constitutional:      Appearance: Normal appearance.  HENT:     Nose:     Left Nostril: Epistaxis present.  Pulmonary:     Effort: Pulmonary effort is normal.  Abdominal:     Comments: Skin breakdown around skin folds surrounding surgical sites. Umbilicus with some erythema, unable to express any discharge  Musculoskeletal:        General: Normal range of motion.  Skin:    Comments: Normal skin color  Neurological:     General: No focal deficit present.     Mental Status: She is alert. Mental status is at baseline.  Psychiatric:        Mood and Affect: Mood normal.        Behavior: Behavior normal.        Thought Content: Thought content  normal.         06/01/2024   11:33 AM 05/24/2024    2:20 PM 03/30/2024    3:40 PM 09/11/2023   10:37 AM 06/30/2023   12:20 PM  Depression screen PHQ 2/9  Decreased Interest 0 0 0 0 0  Down, Depressed, Hopeless 0 0 0 0 0  PHQ - 2 Score 0 0 0 0 0  Altered sleeping   0  0  Tired, decreased energy   0  0  Change in appetite   0  0  Feeling bad or failure about yourself    0  0  Trouble concentrating   0  0  Moving slowly or fidgety/restless   0  0  Suicidal thoughts   0  0  PHQ-9 Score   0  0  Difficult doing work/chores   Not difficult at all  Not difficult at all       03/30/2024    3:41 PM 06/30/2023   12:20 PM  GAD 7 : Generalized Anxiety Score  Nervous, Anxious, on Edge 0 0  Control/stop worrying 0 0  Worry too much - different things 0 0  Trouble relaxing 0 0  Restless 0 0  Easily annoyed or irritable 0 0  Afraid - awful might  happen 0 0  Total GAD 7 Score 0 0  Anxiety Difficulty Not difficult at all Not difficult at all       Assessment & Plan:  Assessment & Plan   Epistaxis Intermittent left nostril epistaxis likely due to blood thinners and weather changes. No active bleeding observed. - Provide Afrin nasal spray for recurrent epistaxis. - Advise use of ice pack for acute episodes. - Consider referral to ENT if epistaxis persists.  Mixed hyperlipidemia Requesting refills. -     Fenofibrate ; Take 1 tablet (160 mg total) by mouth daily.  Dispense: 90 tablet; Refill: 3  Encounter for post surgical wound check Postoperative wound with sutures intact. Bleeding noted but no active bleeding. Skin raw from moisture and contact. - Advise keeping the wound area dry. - Follow up with gynecology - Recommend use of nystatin  powder to reduce moisture. - Reassure that sutures appear closed and intact.     Follow up plan: Return in about 3 months (around 10/14/2024).  Hadassah SHAUNNA Nett, MD

## 2024-07-26 ENCOUNTER — Encounter: Payer: Self-pay | Admitting: Pediatrics

## 2024-08-03 ENCOUNTER — Inpatient Hospital Stay: Payer: PRIVATE HEALTH INSURANCE | Attending: Obstetrics and Gynecology | Admitting: Obstetrics and Gynecology

## 2024-08-03 ENCOUNTER — Encounter: Payer: Self-pay | Admitting: Obstetrics and Gynecology

## 2024-08-03 VITALS — BP 150/53 | HR 60 | Temp 98.6°F | Resp 16 | Wt 251.1 lb

## 2024-08-03 DIAGNOSIS — C541 Malignant neoplasm of endometrium: Secondary | ICD-10-CM

## 2024-08-03 DIAGNOSIS — Z90722 Acquired absence of ovaries, bilateral: Secondary | ICD-10-CM

## 2024-08-03 DIAGNOSIS — Z9079 Acquired absence of other genital organ(s): Secondary | ICD-10-CM

## 2024-08-03 DIAGNOSIS — R040 Epistaxis: Secondary | ICD-10-CM

## 2024-08-03 DIAGNOSIS — Z90710 Acquired absence of both cervix and uterus: Secondary | ICD-10-CM

## 2024-08-03 DIAGNOSIS — C55 Malignant neoplasm of uterus, part unspecified: Secondary | ICD-10-CM | POA: Insufficient documentation

## 2024-08-03 NOTE — Progress Notes (Signed)
 Gynecologic Oncology Consult Visit   Referring Provider: Dr. Starla  Chief Complaint: Endometrioid cancer in mass prolapsing through cervix.   Subjective:  Tammy Chavez is a 63 y.o. G0P0 female who is seen in consultation from Dr. Herold for postmenopausal bleeding.  On 06/28/24 she underwent TLH BSO with mini-lap, mapping, and biopsies with Dr Mancil at [facility]. Still has some pain in LLQ, but this was there before surgery.  DIAGNOSIS   A.  Uterus, bilateral ovaries and fallopian tubes, total hysterectomy and bilateral salpingo-oophorectomy:   Endometrioid adenocarcinoma of the endometrium, FIGO grade 1 of 3. Myometrial invasion: Present, tumor invades 3 mm in a 26 mm thick wall. Lymphatic/vascular invasion: Absent.  Cervical stromal invasion: Absent.   Bilateral ovaries: Negative for malignancy.   Bilateral fallopian tubes: Negative for malignancy.    Additional findings: Endometrial intraepithelial neoplasia (EIN) Endometrial polyp Adenomyosis Leiomyomata (largest 1.7 cm)   P53 immunohistochemistry: Wild type (normal)  Synoptic Report ENDOMETRIUM ENDOMETRIUM - All Specimens AJCC 8 - Protocol posted: 10/07/2023  SPECIMEN    Procedure:    Total hysterectomy    Procedure:    Bilateral salpingo-oophorectomy    Specimen Integrity:    Intact  TUMOR    Histologic Type:    Endometrioid carcinoma    Histologic Grade:    FIGO grade 1    Myometrial Invasion:    Present, inner half (less than 50%)    Adenomyosis:    Present, uninvolved by carcinoma    Uterine Serosal Involvement:    Not identified    Lower Uterine Segment Involvement:    Present, myoinvasive    Cervical Involvement:    Not identified    Other Tissue / Organ Involvement:    Not applicable (no other tissues / organs submitted)    Peritoneal / Pelvic Washings / Ascitic Fluid:    Negative for malignant cells    Lymphatic and / or Vascular Invasion:    Not identified  REGIONAL LYMPH NODES    Regional Lymph Node  Status:    Not applicable (no regional lymph nodes submitted or found)  pTNM CLASSIFICATION (AJCC 8th Edition)    Reporting of pT, pN, and (when applicable) pM categories is based on information available to the pathologist at the time the report is issued. As per the AJCC (Chapter 1, 8th Ed.) it is the managing physician's responsibility to establish the final pathologic stage based upon all pertinent information, including but potentially not limited to this pathology report.    pT Category:    pT1a    pN Category:    pN not assigned (no nodes submitted or found)  FIGO STAGE    FIGO Stage (FIGO 2009 Staging / 2018 FIGO Cancer Report):    IA     Pelvic Washing- NEGATIVE. NO EVIDENCE OF MALIGNANCY.   Endometrium, Mismatch repair analysis by immunohistochemistry and PCR:  Mismatch repair: DEFICIENT Immunohistochemistry: Abnormal result. Loss of MLH1 and PMS2 expression in the neoplastic cells. PCR: Microsatellite instability, high (MSI-H).   Reflex testing of MLH1 promoter methylation test is pending.     % of Cells Staining Intensity Score Interpretation  ER IHC 90 3+ POSITIVE  PR IHC 97 3+ POSITIVE      Gynecologic Oncology History:  Patient noticed scant spotting in June 2025.  She was seen in the ER and ultrasound at that time showed thickened endometrium measuring 8.8 mm and small fibroid.  Patient was seen by Dr. Starla on 05/24/2024.  Pap was obtained.  Patient  had poor tolerance to pelvic exam and therefore, endometrial biopsy was not obtained.  However, on exam, Dr. Samul noted a cervical mass, friable.  Due to concerns of hemorrhage, biopsy was not obtained.  05/24/2024- Pap-atypical glandular cells favoring neoplastic, favoring adenocarcinoma of endometrial origin.  High risk HPV was negative.  Patient seen last week by Gyn onc for further workup and management. Biopsy shows grade 1 endometrioid cancer. Most likely this is a primary uterine cancer.   Loss of MLH1 and PMS2  with intact MSH6 and MSH2.  MLH1 promoter methylation not performed. TP53 WT  She denies unintentional weight loss, shortness of breath. Endorses some left sided pelvic cramping. Denies changes in bowel movements or urination.  She has light bleeding.  CT scan 06/03/24 CHEST FINDINGS - Cardiovascular: Aortic atherosclerosis. Mild cardiomegaly. Three-vessel coronary artery calcifications. No pericardial effusion. - Mediastinum/Nodes: No enlarged mediastinal, hilar, or axillary lymph nodes. Thyroid  gland, trachea, and esophagus demonstrate no significant findings. - Lungs/Pleura: Lungs are clear. No pleural effusion or pneumothorax.  - Musculoskeletal: No chest wall abnormality. No acute osseous findings.   CT ABDOMEN PELVIS FINDINGS - Hepatobiliary: No solid liver abnormality is seen. Hepatomegaly, maximum coronal span 21.4 cm. No gallstones, gallbladder wall thickening, or biliary dilatation. - Pancreas: Unremarkable. No pancreatic ductal dilatation or surrounding inflammatory changes. - Spleen: Normal in size without significant abnormality. - Adrenals/Urinary Tract: Adrenal glands are unremarkable. Small nonobstructive calculus of the superior pole of the left kidney. No right-sided calculi, ureteral calculi, or hydronephrosis. Bladder is unremarkable. - Stomach/Bowel: Stomach is within normal limits. Appendix appears  normal. No evidence of bowel wall thickening, distention, or inflammatory changes. - Vascular/Lymphatic: Aortic atherosclerosis. No enlarged abdominal or pelvic lymph nodes. - Reproductive: Somewhat ill-defined hypodensity of the cervix (series 2, image 100, series 5, image 113). No other abnormality of the uterus. Normal postmenopausal ovaries. - Other: No abdominal wall hernia or abnormality. No ascites. - Musculoskeletal: No acute osseous findings.   IMPRESSION: 1. Somewhat ill-defined hypodensity of the cervix, incompletely characterized. Suspect that these reflect  nabothian cysts however in the specifically stated setting of concern for gyn malignancy consider contrast enhanced pelvic MRI to definitively characterize. 2. No evidence of lymphadenopathy or metastatic disease in the chest, abdomen, or pelvis. 3. Hepatomegaly. 4. Nonobstructive left nephrolithiasis.  On exam she was noted to have 2-3cm prolapsed mass through the os that was bleeding and friable.   Problem List: Patient Active Problem List   Diagnosis Date Noted   Abnormal uterine bleeding (AUB) 04/04/2024   Screening for colon cancer 06/30/2023   Screening mammogram for breast cancer 06/30/2023   Atrial fibrillation (HCC) 06/30/2023   Prediabetes 06/30/2023   Routine general medical examination at a health care facility 06/27/2023   Chronic cough 05/26/2023   Cardiomegaly 05/26/2023   Pleural effusion on right 05/26/2023   Mixed hyperlipidemia 01/09/2023   Adult body mass index 50.0-59.9 (HCC) 01/09/2023   Hypokalemia 10/23/2014   Peripheral edema 10/23/2014   Obesity 10/23/2014   Primary hypertension 07/25/2011   Gout 07/25/2011   Hyperlipidemia 04/04/2010   Anemia 04/04/2010   Past Medical History: Past Medical History:  Diagnosis Date   Atrial fibrillation (HCC)    CAD (coronary artery disease)    Chronic diastolic heart failure (HCC)    Gout    Grade II diastolic dysfunction    Heel spur    Left   Hyperlipidemia    Hypertension    Mild mitral regurgitation by prior echocardiogram    Mild mitral  regurgitation by prior echocardiogram    Uncontrolled hypertension    Past Surgical History: Past Surgical History:  Procedure Laterality Date   ACHILLES TENDON REPAIR     CATARACT EXTRACTION W/PHACO Left 05/18/2024   Procedure: PHACOEMULSIFICATION, CATARACT, WITH IOL INSERTION 9.73, 00:46.3;  Surgeon: Mittie Gaskin, MD;  Location: [facility];  Service: Ophthalmology;  Laterality: Left;   RIGHT/LEFT HEART CATH AND CORONARY ANGIOGRAPHY Bilateral  08/26/2023   Procedure: RIGHT/LEFT HEART CATH AND CORONARY ANGIOGRAPHY;  Surgeon: Rolan Ezra RAMAN, MD;  Location: [facility];  Service: Cardiovascular;  Laterality: Bilateral;   Past Gynecologic History:  Post menopausal x 25 years Never sexually active.  G0P0  OB History:  OB History  No obstetric history on file.   Family History: Family History  Problem Relation Age of Onset   Heart disease Mother    Diabetes Sister    Diabetes Sister    Hyperlipidemia Brother    Hyperlipidemia Brother    Hyperlipidemia Brother    Bladder Cancer Maternal Uncle    Social History: Social History   Socioeconomic History   Marital status: Single    Spouse name: Not on file   Number of children: Not on file   Years of education: Not on file   Highest education level: Associate degree: occupational, Scientist, product/process development, or vocational program  Occupational History   Not on file  Tobacco Use   Smoking status: Never   Smokeless tobacco: Never  Vaping Use   Vaping status: Never Used  Substance and Sexual Activity   Alcohol use: No   Drug use: No   Sexual activity: Not Currently  Other Topics Concern   Not on file  Social History Narrative   Lives with [place], sister.    Social Drivers of Corporate investment banker Strain: Low Risk  (07/11/2024)   Overall Financial Resource Strain (CARDIA)    Difficulty of Paying Living Expenses: Not very hard  Food Insecurity: No Food Insecurity (07/11/2024)   Hunger Vital Sign    Worried About Running Out of Food in the Last Year: Never true    Ran Out of Food in the Last Year: Never true  Recent Concern: Food Insecurity - Food Insecurity Present (06/28/2024)   Received from [facility]   Hunger Vital Sign    Within the past 12 months, you worried that your food would run out before you got the money to buy more.: Sometimes true    Within the past 12 months, the food you bought just didn't last and you didn't have money to get  more.: Sometimes true  Transportation Needs: No Transportation Needs (07/11/2024)   PRAPARE - Administrator, Civil Service (Medical): No    Lack of Transportation (Non-Medical): No  Physical Activity: Inactive (07/11/2024)   Exercise Vital Sign    Days of Exercise per Week: 0 days    Minutes of Exercise per Session: Not on file  Stress: No Stress Concern Present (07/11/2024)   [employer] of Occupational Health - Occupational Stress Questionnaire    Feeling of Stress: Only a little  Social Connections: Moderately Isolated (07/11/2024)   Social Connection and Isolation Panel    Frequency of Communication with Friends and Family: More than three times a week    Frequency of Social Gatherings with Friends and Family: Three times a week    Attends Religious Services: 1 to 4 times per year    Active Member of Clubs or Organizations: No  Attends Banker Meetings: Not on file    Marital Status: Never married  Intimate Partner Violence: Not At Risk (06/01/2024)   Humiliation, Afraid, Rape, and Kick questionnaire    Fear of Current or Ex-Partner: No    Emotionally Abused: No    Physically Abused: No    Sexually Abused: No  Has 8 siblings.   Allergies: Allergies  Allergen Reactions   Lisinopril Cough    Cough   Current Medications: Current Outpatient Medications  Medication Sig Dispense Refill   albuterol  (VENTOLIN  HFA) 108 (90 Base) MCG/ACT inhaler Inhale 2 puffs into the lungs every 6 (six) hours as needed for wheezing or shortness of breath. 8 g 2   allopurinol  (ZYLOPRIM ) 300 MG tablet Take 1 tablet (300 mg total) by mouth daily. 90 tablet 3   apixaban  (ELIQUIS ) 5 MG TABS tablet Take 1 tablet (5 mg total) by mouth 2 (two) times daily. 180 tablet 3   atorvastatin  (LIPITOR) 80 MG tablet Take 1 tablet (80 mg total) by mouth daily. 90 tablet 3   Cyanocobalamin 5000 MCG TBDP Take 5,000 mcg by mouth daily.     ezetimibe  (ZETIA ) 10 MG tablet Take 10 mg by mouth  daily.     FARXIGA  10 MG TABS tablet Take 1 tablet (10 mg total) by mouth daily before breakfast. NEEDS FOLLOW UP APPOINTMENT FOR MORE REFILLS 90 tablet 3   fenofibrate  160 MG tablet Take 1 tablet (160 mg total) by mouth daily. 90 tablet 3   ibuprofen (ADVIL) 800 MG tablet Take 800 mg by mouth.     metoprolol  succinate (TOPROL -XL) 100 MG 24 hr tablet Take 1 tablet (100 mg total) by mouth daily. Take with or immediately following a meal. 90 tablet 0   nystatin  (MYCOSTATIN /NYSTOP ) powder Apply 1 Application topically 3 (three) times daily. To inguinal skin folds as needed 60 g 1   ondansetron  (ZOFRAN -ODT) 4 MG disintegrating tablet Take 4 mg by mouth every 8 (eight) hours as needed.     sacubitril -valsartan  (ENTRESTO ) 49-51 MG Take 1 tablet by mouth 2 (two) times daily. 180 tablet 3   spironolactone  (ALDACTONE ) 25 MG tablet Take 0.5 tablets (12.5 mg total) by mouth daily. 90 tablet 0   oxyCODONE (OXY IR/ROXICODONE) 5 MG immediate release tablet Take 5 mg by mouth every 4 (four) hours as needed. (Patient not taking: Reported on 08/03/2024)     No current facility-administered medications for this visit.   Review of System General: negative for fevers, changes in weight or night sweats Skin: negative for changes in moles or sores or rash Eyes: negative for changes in vision HEENT: negative for change in hearing, tinnitus, voice changes Pulmonary: negative for dyspnea, orthopnea, productive cough, wheezing Cardiac: negative for palpitations, pain Gastrointestinal: negative for nausea, vomiting, constipation, diarrhea, hematemesis, hematochezia Genitourinary/Sexual: negative for dysuria, retention, hematuria, incontinence Ob/Gyn:  positive for spotting & pain Musculoskeletal: negative for pain, joint pain, back pain Hematology: negative for easy bruising, abnormal bleeding Neurologic/Psych: negative for headaches, seizures, paralysis, weakness, numbness   Objective:  Physical Examination:  BP  (!) 150/53   Pulse 60   Temp 98.6 F (37 C)   Resp 16   Wt 251 lb 1.6 oz (113.9 kg)   LMP 11/03/2013   SpO2 97%   BMI 49.04 kg/m     ECOG Performance Status: 1 - Symptomatic but completely ambulatory  GENERAL: Patient is a well appearing female in no acute distress HEENT:  Sclerae anicteric.  Oropharynx clear and moist. No  ulcerations or evidence of oropharyngeal candidiasis. Neck is supple.  NODES:  No cervical, supraclavicular, or axillary lymphadenopathy palpated. There is a palpable inguinal node 1.5-2 cm on the right. No palpable nodes on the left.  LUNGS: diminished breath sounds on right HEART:  irregular rhythm  ABDOMEN:  Soft, nontender. Incisions well healed.  MSK:  No focal spinal tenderness to palpation. Full range of motion bilaterally in the upper extremities. EXTREMITIES:  bilateral peripheral edema 1+ SKIN:  Clear with no obvious rashes or skin changes.  NEURO:  Nonfocal. Well oriented.  Appropriate affect.  Pelvic: Exam chaperoned by NP.    EGBUS: normal Vagina: healing well Bimanual: no masses Rectovaginal: Not performed   Assessment:  Tammy Chavez is a 63 y.o. female with prolapsing cervical mass concerning for malignancy. Pathology of biopsy shows grade 1 endometrioid cancer. Most likely this is a primary uterine cancer, although unclear if cervical or uterine on CT scan. No evidence of metastatic disease.  MSI and loss of MLH1 and PMS2 with intact MSH6 and MSH2.  MLH1 promoter methylation pending.  TP53 WT.  ER/PR 3+  Medical co-morbidities complicating care:  Atrial fibrillation (HCC)  CAD (coronary artery disease)  Chronic diastolic heart failure (HCC)  Gout  Grade II diastolic dysfunction  Body mass index is 49.04 kg/m. Mild mitral regurgitation by prior echocardiogram  Uncontrolled hypertension    Plan:   Problem List Items Addressed This Visit       Genitourinary   Uterine cancer (HCC) - Primary   Discussed that stage IA grade 1  endometrial adenocarcinoma has a low risk of recurrence. No need for adjuvant therapy.   We will see her back in 4 months for follow up and then q24mo.    We will refer her to ENT due to nose bleeds, including one today.    Tinnie Dawn, DNP, AGNP-C, AOCNP [facility] [phone] (clinic)  I personally had a face to face interaction and evaluated the patient jointly with the NP, Ms. Tinnie Dawn.  I have reviewed her history and available records and have performed the key portions of the physical exam including lymph node survey, abdominal exam, pelvic exam with my findings confirming those documented above by the APP.  I have discussed the case with the APP and the patient.  I agree with the above documentation, assessment and plan which was fully formulated by me.  Counseling was completed by me.   I personally saw the patient and performed a substantive portion of this encounter in conjunction with the listed APP as documented above.  Prentice Agent, MD  CC:  Starla Harland BROCKS, MD [address] [place],  [place] [address] [phone]

## 2024-08-05 ENCOUNTER — Telehealth: Payer: Self-pay

## 2024-08-05 NOTE — Telephone Encounter (Signed)
 Sent a referral for frequent nose bleed. Fax sent over to [facility] on 08/04/2024. Fax sent okay!

## 2024-08-26 ENCOUNTER — Ambulatory Visit (INDEPENDENT_AMBULATORY_CARE_PROVIDER_SITE_OTHER): Payer: PRIVATE HEALTH INSURANCE | Admitting: Pediatrics

## 2024-08-26 ENCOUNTER — Encounter: Payer: Self-pay | Admitting: Pediatrics

## 2024-08-26 ENCOUNTER — Ambulatory Visit
Admission: RE | Admit: 2024-08-26 | Discharge: 2024-08-26 | Disposition: A | Discharge status: Home / Self Care | Payer: PRIVATE HEALTH INSURANCE | Source: Ambulatory Visit | Attending: Pediatrics | Admitting: Pediatrics

## 2024-08-26 ENCOUNTER — Ambulatory Visit
Admission: RE | Admit: 2024-08-26 | Discharge: 2024-08-26 | Disposition: A | Discharge status: Home / Self Care | Payer: PRIVATE HEALTH INSURANCE | Attending: Pediatrics | Admitting: Pediatrics

## 2024-08-26 VITALS — BP 141/75 | HR 61 | Temp 98.5°F | Ht 60.0 in | Wt 252.0 lb

## 2024-08-26 DIAGNOSIS — M25512 Pain in left shoulder: Secondary | ICD-10-CM | POA: Diagnosis not present

## 2024-08-26 DIAGNOSIS — G4700 Insomnia, unspecified: Secondary | ICD-10-CM | POA: Diagnosis not present

## 2024-08-26 DIAGNOSIS — L304 Erythema intertrigo: Secondary | ICD-10-CM | POA: Diagnosis not present

## 2024-08-26 DIAGNOSIS — B379 Candidiasis, unspecified: Secondary | ICD-10-CM

## 2024-08-26 DIAGNOSIS — L600 Ingrowing nail: Secondary | ICD-10-CM | POA: Diagnosis not present

## 2024-08-26 MED ORDER — KETOCONAZOLE 2 % EX CREA: 1.0000 | TOPICAL_CREAM | Freq: Every day | CUTANEOUS | 0 refills | Status: AC

## 2024-08-26 MED ORDER — ESZOPICLONE 1 MG PO TABS: 1.0000 mg | ORAL_TABLET | Freq: Every evening | ORAL | 1 refills | Status: AC | PRN

## 2024-08-26 MED ORDER — MELOXICAM 15 MG PO TABS: 15.0000 mg | ORAL_TABLET | Freq: Every day | ORAL | 1 refills | Status: DC

## 2024-08-26 NOTE — Patient Instructions (Signed)
 [facility] [address] [place],  [place]  [address]

## 2024-08-26 NOTE — Progress Notes (Signed)
 Office Visit  BP (!) 141/75 (BP Location: Left Arm, Cuff Size: Small)   Pulse 61   Temp 98.5 F (36.9 C) (Oral)   Ht 5' (1.524 m)   Wt 252 lb (114.3 kg)   LMP 11/03/2013   SpO2 97%   BMI 49.22 kg/m    Subjective:    Patient ID: Tammy Chavez, female    DOB: 11/18/1960, 63 y.o.   MRN: [id number]  HPI: Tammy Chavez is a 63 y.o. female  Chief Complaint  Patient presents with   Shoulder Pain    Patient states she has been having left shoulder pain for several years. States that she sleeps with the left arm raised and feels that she has just injured it over time. States that the pain has started to radiate down her arm to her elbow. Describes the pain as being sharp sometimes, and other times just achy.    Ingrown Toenail    Patient states she has an ingrown toenail on both of her big toes, and on her second toe on the left foot.    Insomnia    Patient states she has been having sleep issues staying asleep for the last few months. States she doesn't have much trouble going to sleep. Has tried otc sleep aids that did not help. States she has tried Trazadone that did not help.     Discussed the use of AI scribe software for clinical note transcription with the patient, who gave verbal consent to proceed.  History of Present Illness   Tammy Chavez is a 63 year old female who presents with chronic shoulder pain radiating down the arm.  She has been experiencing shoulder pain for an extended period, described as 'forever.' The pain is primarily located in the front of the shoulder, exacerbated by her sleeping position, and has recently started radiating down her arm. It is aggravated by certain movements, causing a pulling sensation. She has not undergone any prior imaging studies such as an x-ray or MRI for this issue. She is currently taking ibuprofen 800 mg but has some left. She has not tried meloxicam  before. The pain persists throughout the day and is not limited to  nighttime.  She reports difficulty sleeping, waking up around 1:00 AM most nights, and sometimes not sleeping at all. She has tried trazodone in the past without success and mentions trying another medication from her sister, which was ineffective. She is familiar with Lunesta but has not tried it yet.  She experiences recurrent ingrown toenails, with the left side of one toe currently causing significant pain. She has not seen a podiatrist for this issue. The ingrown toenail on the big toe is the most problematic, and she describes pus coming out of the area recently.  She mentions sweating excessively, particularly under the breast area, and has been using powder without relief. She has not been using any ointments or creams for this issue.  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 She is dissatisfied with her current job situation and is considering a change. She mentions issues with her paid time off and short-term disability benefits.      Relevant past medical, surgical, family and social history reviewed and updated as indicated. Interim medical history since our last visit reviewed. Allergies and medications reviewed and updated.  ROS per HPI unless specifically indicated above     Objective:    BP (!) 141/75 (BP  Location: Left Arm, Cuff Size: Small)   Pulse 61   Temp 98.5 F (36.9 C) (Oral)   Ht 5' (1.524 m)   Wt 252 lb (114.3 kg)   LMP 11/03/2013   SpO2 97%   BMI 49.22 kg/m   Wt Readings from Last 3 Encounters:  08/26/24 252 lb (114.3 kg)  08/03/24 251 lb 1.6 oz (113.9 kg)  07/15/24 253 lb 12.8 oz (115.1 kg)     Physical Exam Constitutional:      Appearance: Normal appearance.  Pulmonary:     Effort: Pulmonary effort is normal.  Musculoskeletal:     Left shoulder: Tenderness present. Decreased range of motion.     Comments: Positive neers and empty can, minimal pain w lift off test  Skin:    Comments:  Normal skin color  Neurological:     General: No focal deficit present.     Mental Status: She is alert. Mental status is at baseline.  Psychiatric:        Mood and Affect: Mood normal.        Behavior: Behavior normal.        Thought Content: Thought content normal.         08/26/2024    1:52 PM 06/01/2024   11:33 AM 05/24/2024    2:20 PM 03/30/2024    3:40 PM 09/11/2023   10:37 AM  Depression screen PHQ 2/9  Decreased Interest 0 0 0 0 0  Down, Depressed, Hopeless 0 0 0 0 0  PHQ - 2 Score 0 0 0 0 0  Altered sleeping 0   0   Tired, decreased energy 0   0   Change in appetite 0   0   Feeling bad or failure about yourself  0   0   Trouble concentrating 0   0   Moving slowly or fidgety/restless 0   0   Suicidal thoughts 0   0   PHQ-9 Score 0    0    Difficult doing work/chores Not difficult at all   Not difficult at all      Data saved with a previous flowsheet row definition       08/26/2024    1:55 PM 03/30/2024    3:41 PM 06/30/2023   12:20 PM  GAD 7 : Generalized Anxiety Score  Nervous, Anxious, on Edge 0 0 0  Control/stop worrying 0 0 0  Worry too much - different things 0 0 0  Trouble relaxing 0 0 0  Restless 0 0 0  Easily annoyed or irritable 0 0 0  Afraid - awful might happen 0 0 0  Total GAD 7 Score 0 0 0  Anxiety Difficulty Not difficult at all Not difficult at all Not difficult at all       Assessment & Plan:  Assessment & Plan   Acute pain of left shoulder Chronic left shoulder pain with radiation, likely due to tendon irritation or rotator cuff strain. No prior imaging. - Order left shoulder x-ray. - Prescribe meloxicam . - Instruct to avoid ibuprofen while taking meloxicam . - Provide address for [facility] for x-ray. -     DG Shoulder Left; Future -     Meloxicam ; Take 1 tablet (15 mg total) by mouth daily.  Dispense: 30 tablet; Refill: 1  Ingrown toenail Recurrent ingrown toenails causing discomfort. No prior podiatry consultation. - Refer  to podiatry. -     Ambulatory referral to Podiatry  Intertrigo Recurrent intertrigo under  breast, unresponsive to powder treatment. - Prescribe ketoconazole cream. - Provide petroleum jelly. -     Ketoconazole; Apply 1 Application topically daily.  Dispense: 15 g; Refill: 0  Insomnia, unspecified type Chronic insomnia with difficulty maintaining sleep. Trazodone ineffective. - Prescribe Lunesta. - Reassess sleep symptoms in 3-4 weeks. -     Eszopiclone; Take 1 tablet (1 mg total) by mouth at bedtime as needed for sleep. Take immediately before bedtime  Dispense: 30 tablet; Refill: 1   Follow up plan: Return in about 3 weeks (around 09/16/2024).  Hadassah SHAUNNA Nett, MD

## 2024-08-31 ENCOUNTER — Ambulatory Visit: Payer: Self-pay | Admitting: Pediatrics

## 2024-08-31 ENCOUNTER — Ambulatory Visit: Payer: PRIVATE HEALTH INSURANCE

## 2024-09-02 ENCOUNTER — Encounter: Payer: Self-pay | Admitting: Pediatrics

## 2024-09-05 ENCOUNTER — Encounter (INDEPENDENT_AMBULATORY_CARE_PROVIDER_SITE_OTHER): Payer: Self-pay

## 2024-09-05 ENCOUNTER — Ambulatory Visit (INDEPENDENT_AMBULATORY_CARE_PROVIDER_SITE_OTHER): Payer: PRIVATE HEALTH INSURANCE

## 2024-09-05 VITALS — BP 145/83 | HR 65 | Temp 98.0°F | Ht 60.0 in | Wt 252.0 lb

## 2024-09-05 DIAGNOSIS — R040 Epistaxis: Secondary | ICD-10-CM | POA: Diagnosis not present

## 2024-09-05 DIAGNOSIS — Z7901 Long term (current) use of anticoagulants: Secondary | ICD-10-CM

## 2024-09-05 DIAGNOSIS — I4891 Unspecified atrial fibrillation: Secondary | ICD-10-CM

## 2024-09-05 NOTE — Progress Notes (Signed)
 HPI:   Discussed the use of AI scribe software for clinical note transcription with the patient, who gave verbal consent to proceed.  History of Present Illness Tammy Chavez is a 63 year old female with atrial fibrillation on Eliquis  who presents with recurrent left-sided epistaxis.  She began experiencing left-sided epistaxis on September 2nd, following her hysterectomy for cancer. The episodes initially occurred daily around 6 PM for two to three weeks and then began to taper off. She also experienced an episode upon waking in the middle of the night. There is no history of epistaxis prior to this event.  The frequency of epistaxis has decreased, with the last occurrence being eight or nine days ago, and sometimes she goes over a week without having one. Initially, the episodes were daily. She denies any use of nasal sprays, nasal surgeries, or trauma.  She has been on Eliquis  for over a year due to atrial fibrillation. She underwent a heart catheterization. She denies smoking, recreational drug use, and reports very rare alcohol use.    PMH/Meds/All/SocHx/FamHx/ROS: Past Medical History:  Diagnosis Date   Atrial fibrillation (HCC)    CAD (coronary artery disease)    Chronic diastolic heart failure (HCC)    Gout    Grade II diastolic dysfunction    Heel spur    Left   Hyperlipidemia    Hypertension    Mild mitral regurgitation by prior echocardiogram    Mild mitral regurgitation by prior echocardiogram    Uncontrolled hypertension    Past Surgical History:  Procedure Laterality Date   ACHILLES TENDON REPAIR     CATARACT EXTRACTION W/PHACO Left 05/18/2024   Procedure: PHACOEMULSIFICATION, CATARACT, WITH IOL INSERTION 9.73, 00:46.3;  Surgeon: Mittie Gaskin, MD;  Location: [facility];  Service: Ophthalmology;  Laterality: Left;   RIGHT/LEFT HEART CATH AND CORONARY ANGIOGRAPHY Bilateral 08/26/2023   Procedure: RIGHT/LEFT HEART CATH AND CORONARY ANGIOGRAPHY;   Surgeon: Rolan Ezra RAMAN, MD;  Location: [facility] INV[facility] CV [facility];  Service: Cardiovascular;  Laterality: Bilateral;   No family history of bleeding disorders, wound healing problems or difficulty with anesthesia.  Social Connections: Moderately Isolated (07/11/2024)   Social Connection and Isolation Panel    Frequency of Communication with Friends and Family: More than three times a week    Frequency of Social Gatherings with Friends and Family: Three times a week    Attends Religious Services: 1 to 4 times per year    Active Member of Clubs or Organizations: No    Attends Engineer, Structural: Not on file    Marital Status: Never married    Current Outpatient Medications:    albuterol  (VENTOLIN  HFA) 108 (90 Base) MCG/ACT inhaler, Inhale 2 puffs into the lungs every 6 (six) hours as needed for wheezing or shortness of breath., Disp: 8 g, Rfl: 2   allopurinol  (ZYLOPRIM ) 300 MG tablet, Take 1 tablet (300 mg total) by mouth daily., Disp: 90 tablet, Rfl: 3   apixaban  (ELIQUIS ) 5 MG TABS tablet, Take 1 tablet (5 mg total) by mouth 2 (two) times daily., Disp: 180 tablet, Rfl: 3   atorvastatin  (LIPITOR) 80 MG tablet, Take 1 tablet (80 mg total) by mouth daily., Disp: 90 tablet, Rfl: 3   Cyanocobalamin 5000 MCG TBDP, Take 5,000 mcg by mouth daily., Disp: , Rfl:    eszopiclone (LUNESTA) 1 MG TABS tablet, Take 1 tablet (1 mg total) by mouth at bedtime as needed for sleep. Take immediately before bedtime, Disp: 30 tablet, Rfl: 1  ezetimibe  (ZETIA ) 10 MG tablet, Take 10 mg by mouth daily., Disp: , Rfl:    FARXIGA  10 MG TABS tablet, Take 1 tablet (10 mg total) by mouth daily before breakfast. NEEDS FOLLOW UP APPOINTMENT FOR MORE REFILLS, Disp: 90 tablet, Rfl: 3   fenofibrate  160 MG tablet, Take 1 tablet (160 mg total) by mouth daily., Disp: 90 tablet, Rfl: 3   ketoconazole (NIZORAL) 2 % cream, Apply 1 Application topically daily., Disp: 15 g, Rfl: 0   meloxicam  (MOBIC ) 15 MG tablet, Take 1 tablet  (15 mg total) by mouth daily., Disp: 30 tablet, Rfl: 1   metoprolol  succinate (TOPROL -XL) 100 MG 24 hr tablet, Take 1 tablet (100 mg total) by mouth daily. Take with or immediately following a meal., Disp: 90 tablet, Rfl: 0   nystatin  (MYCOSTATIN /NYSTOP ) powder, Apply 1 Application topically 3 (three) times daily. To inguinal skin folds as needed, Disp: 60 g, Rfl: 1   sacubitril -valsartan  (ENTRESTO ) 49-51 MG, Take 1 tablet by mouth 2 (two) times daily., Disp: 180 tablet, Rfl: 3   spironolactone  (ALDACTONE ) 25 MG tablet, Take 0.5 tablets (12.5 mg total) by mouth daily., Disp: 90 tablet, Rfl: 0 A complete ROS was performed with pertinent positives/negatives noted in the HPI. The remainder of the ROS are negative.   Physical Exam:  BP (!) 145/83 (BP Location: Left Arm, Patient Position: Sitting, Cuff Size: Normal)   Pulse 65   Temp 98 F (36.7 C)   Ht 5' (1.524 m)   Wt 252 lb (114.3 kg)   LMP 11/03/2013   SpO2 97%   BMI 49.22 kg/m  General: Well developed, well nourished. No acute distress. Head/Face: Normocephalic. No sinus tenderness. Facial nerve intact and equal bilaterally. No facial lacerations. Eyes: PERRL, no scleral icterus or conjunctival hemorrhage. EOMI. Ears: No gross deformity. Normal external canal. Tympanic membrane in tact bilaterally Hearing: Normal speech reception.  Nose: No gross deformity or lesions. No purulent discharge. Bilateral inferior turbinate hypertrophy. Left anterior nasal septum with prominent vasculature and area of scabbing without active bleeding Mouth/Oropharynx: Lips without any lesions. Dentition good. No mucosal lesions within the oropharynx. No tonsillar enlargement, exudate, or lesions. Pharyngeal walls symmetrical. Uvula midline. Tongue midline without lesions. Larynx: See TFL if applicable Nasopharynx: See TFL if applicable Neck: Trachea midline. No masses. No thyromegaly or nodules palpated. No crepitus. Lymphatic: No lymphadenopathy in the  neck. Respiratory: No stridor or distress. Room air. Cardiovascular: Regular rate and rhythm. Extremities: No edema or cyanosis. Warm and well-perfused. Skin: No scars or lesions on face or neck. Neurologic: CN II-XII grossly intact. Moving all extremities without gross abnormality. Other:  Independent Review of Additional Tests or Records: None Procedures: Diagnostic Nasal Endoscopy  Pre-procedure diagnosis: Concern for epistaxis Post-procedure diagnosis: same Indication: See pre-procedure diagnosis and physical exam above Complications: None apparent Anesthesia: Lidocaine  4% and topical decongestant was topically sprayed in each nasal cavity  Description of Procedure:  Patient was identified. A flexible endoscope was utilized to evaluate the sinonasal cavities, mucosa, sinus ostia and turbinates and septum. No mucopurulence, polyps, or masses noted.  No vascular masses noted within the left nasal cavity. Anterior left nasal septum with prominent vasculature and area of scabbing with no active bleeding noted.   CPT CODE -- 68768 - Mod 25  Impression & Plans:  Assessment and Plan Assessment & Plan Recurrent left-sided epistaxis Likely trauma from nasal oxygen and anticoagulation with Eliquis . Prominent vessel on left septum identified as source. - Performed nasal endoscopy - Advised nasal moisturization with  Ayr nasal saline gel TID or more if needed - Educated on avoiding aggressive nasal manipulation with tissues or digits to all septum to heal - Afrin as needed during active episodes of epistaxis   Follow-up as needed.  Adah Malkin, DO [facility]

## 2024-09-06 MED ORDER — AYR SALINE NASAL NA GEL: 1.0000 | Freq: Three times a day (TID) | NASAL | 1 refills | Status: AC

## 2024-09-16 ENCOUNTER — Ambulatory Visit: Payer: PRIVATE HEALTH INSURANCE | Admitting: Podiatry

## 2024-09-16 ENCOUNTER — Encounter: Payer: Self-pay | Admitting: Podiatry

## 2024-09-16 VITALS — Ht 60.0 in | Wt 252.0 lb

## 2024-09-16 DIAGNOSIS — L600 Ingrowing nail: Secondary | ICD-10-CM

## 2024-09-16 NOTE — Progress Notes (Signed)
   Chief Complaint  Patient presents with   Ingrown Toenail    Pt is here due to possible ingrown to both great toenails, they have been there for a while and looking to get them removed.    Subjective: Patient presents today for evaluation of pain to the medial and lateral border right great toe. Patient is concerned for possible ingrown nail.  It is very sensitive to touch.  Patient presents today for further treatment and evaluation.  Past Medical History:  Diagnosis Date   Atrial fibrillation (HCC)    CAD (coronary artery disease)    Chronic diastolic heart failure (HCC)    Gout    Grade II diastolic dysfunction    Heel spur    Left   Hyperlipidemia    Hypertension    Mild mitral regurgitation by prior echocardiogram    Mild mitral regurgitation by prior echocardiogram    Uncontrolled hypertension     Past Surgical History:  Procedure Laterality Date   ACHILLES TENDON REPAIR     CATARACT EXTRACTION W/PHACO Left 05/18/2024   Procedure: PHACOEMULSIFICATION, CATARACT, WITH IOL INSERTION 9.73, 00:46.3;  Surgeon: Mittie Gaskin, MD;  Location: [facility];  Service: Ophthalmology;  Laterality: Left;   RIGHT/LEFT HEART CATH AND CORONARY ANGIOGRAPHY Bilateral 08/26/2023   Procedure: RIGHT/LEFT HEART CATH AND CORONARY ANGIOGRAPHY;  Surgeon: Rolan Ezra RAMAN, MD;  Location: [facility] INV[facility];  Service: Cardiovascular;  Laterality: Bilateral;    Allergies  Allergen Reactions   Lisinopril Cough    Cough    Objective:  General: Well developed, nourished, in no acute distress, alert and oriented x3   Dermatology: Skin is warm, dry and supple bilateral.  Medial and lateral border right great toe is tender with evidence of an ingrowing nail. Pain on palpation noted to the border of the nail fold. The remaining nails appear unremarkable at this time.   Vascular: DP and PT pulses palpable.  No clinical evidence of vascular compromise  Neruologic: Grossly intact via  light touch bilateral.  Musculoskeletal: No pedal deformity noted  Assesement: #1 Paronychia with ingrowing nail medial and lateral border right great toe  Plan of Care:  -Patient evaluated.  -Discussed treatment alternatives and plan of care. Explained nail avulsion procedure and post procedure course to patient. -Patient opted for permanent partial nail avulsion of the ingrown portion of the nail.  -Prior to procedure, local anesthesia infiltration utilized using 3 ml of a 50:50 mixture of 2% plain lidocaine  and 0.5% plain marcaine in a normal hallux block fashion and a betadine prep performed.  -Partial permanent nail avulsion with chemical matrixectomy performed using 3x30sec applications of phenol followed by alcohol flush.  -Light dressing applied.  Post care instructions provided -Return to clinic 3 weeks  Thresa EMERSON Sar, DPM [facility]  Dr. Thresa EMERSON Sar, DPM    [address] [place], [place] [address]                Office [phone]  Fax [phone]

## 2024-10-06 ENCOUNTER — Other Ambulatory Visit: Payer: Self-pay | Admitting: Cardiology

## 2024-10-07 ENCOUNTER — Ambulatory Visit: Payer: PRIVATE HEALTH INSURANCE | Admitting: Podiatry

## 2024-10-07 ENCOUNTER — Ambulatory Visit: Payer: PRIVATE HEALTH INSURANCE | Admitting: Pediatrics

## 2024-10-07 VITALS — Ht 60.0 in | Wt 252.0 lb

## 2024-10-07 DIAGNOSIS — L600 Ingrowing nail: Secondary | ICD-10-CM

## 2024-10-07 NOTE — Progress Notes (Signed)
° °  Chief Complaint  Patient presents with   Ingrown Toenail    Pt is here to f/u on right great toenail after having ingrown removed on 11/21 she states that it is healing slow, but has no pain or drainage from it.    Subjective: 63 y.o. female presents today status post permanent nail avulsion procedure of the medial and lateral border of the right great toe that was performed on 09/16/2024.   Past Medical History:  Diagnosis Date   Atrial fibrillation (HCC)    CAD (coronary artery disease)    Chronic diastolic heart failure (HCC)    Gout    Grade II diastolic dysfunction    Heel spur    Left   Hyperlipidemia    Hypertension    Mild mitral regurgitation by prior echocardiogram    Mild mitral regurgitation by prior echocardiogram    Uncontrolled hypertension     Objective: Neurovascular status intact.  Skin is warm, dry and supple. Nail and respective nail fold appears to be healing appropriately.   Assessment: #1 s/p partial permanent nail matrixectomy medial and lateral border right great toe.  09/16/2024   Plan of care: #1 patient was evaluated  #2 light debridement of the periungual debris was performed to the border of the respective toe and nail plate using a tissue nipper. #3 patient is to return to clinic on a PRN basis.   Thresa EMERSON Sar, DPM [facility]  Dr. Thresa EMERSON Sar, DPM    [address] [place], [place] [address]                Office [phone]  Fax [phone]

## 2024-10-14 ENCOUNTER — Ambulatory Visit: Payer: PRIVATE HEALTH INSURANCE | Admitting: Pediatrics

## 2024-10-14 ENCOUNTER — Encounter: Payer: Self-pay | Admitting: Pediatrics

## 2024-10-14 VITALS — BP 120/72 | HR 61 | Temp 98.4°F | Ht 62.0 in | Wt 254.2 lb

## 2024-10-14 DIAGNOSIS — R61 Generalized hyperhidrosis: Secondary | ICD-10-CM

## 2024-10-14 DIAGNOSIS — G4700 Insomnia, unspecified: Secondary | ICD-10-CM | POA: Diagnosis not present

## 2024-10-14 DIAGNOSIS — I10 Essential (primary) hypertension: Secondary | ICD-10-CM | POA: Diagnosis not present

## 2024-10-14 DIAGNOSIS — M19012 Primary osteoarthritis, left shoulder: Secondary | ICD-10-CM | POA: Diagnosis not present

## 2024-10-14 MED ORDER — DRYSOL 20 % EX SOLN: Freq: Two times a day (BID) | CUTANEOUS | 1 refills | Status: AC

## 2024-10-14 MED ORDER — ESZOPICLONE 3 MG PO TABS: 3.0000 mg | ORAL_TABLET | Freq: Every evening | ORAL | 0 refills | Status: AC | PRN

## 2024-10-14 NOTE — Progress Notes (Unsigned)
" ° °  Office Visit  BP 120/72   Pulse 61   Temp 98.4 F (36.9 C) (Oral)   Ht 5' 2 (1.575 m)   Wt 254 lb 3.2 oz (115.3 kg)   LMP 11/03/2013   SpO2 98%   BMI 46.49 kg/m    Subjective:    Patient ID: Tammy Chavez, female    DOB: 04/11/1961, 63 y.o.   MRN: [id number]  HPI: Tammy Chavez is a 63 y.o. female  Chief Complaint  Patient presents with   Hyperlipidemia   Hypertension    Discussed the use of AI scribe software for clinical note transcription with the patient, who gave verbal consent to proceed.  History of Present Illness     Relevant past medical, surgical, family and social history reviewed and updated as indicated. Interim medical history since our last visit reviewed. Allergies and medications reviewed and updated.  ROS per HPI unless specifically indicated above     Objective:    BP 120/72   Pulse 61   Temp 98.4 F (36.9 C) (Oral)   Ht 5' 2 (1.575 m)   Wt 254 lb 3.2 oz (115.3 kg)   LMP 11/03/2013   SpO2 98%   BMI 46.49 kg/m   Wt Readings from Last 3 Encounters:  10/14/24 254 lb 3.2 oz (115.3 kg)  10/07/24 252 lb (114.3 kg)  09/16/24 252 lb (114.3 kg)     Physical Exam      10/14/2024    1:15 PM 08/26/2024    1:52 PM 06/01/2024   11:33 AM 05/24/2024    2:20 PM 03/30/2024    3:40 PM  Depression screen PHQ 2/9  Decreased Interest 0 0 0 0 0  Down, Depressed, Hopeless 0 0 0 0 0  PHQ - 2 Score 0 0 0 0 0  Altered sleeping 3 0   0  Tired, decreased energy 3 0   0  Change in appetite 0 0   0  Feeling bad or failure about yourself  0 0   0  Trouble concentrating 0 0   0  Moving slowly or fidgety/restless 0 0   0  Suicidal thoughts 0 0   0  PHQ-9 Score 6 0    0   Difficult doing work/chores Not difficult at all Not difficult at all   Not difficult at all     Data saved with a previous flowsheet row definition       10/14/2024    1:15 PM 08/26/2024    1:55 PM 03/30/2024    3:41 PM 06/30/2023   12:20 PM  GAD 7 : Generalized Anxiety  Score  Nervous, Anxious, on Edge 0 0 0 0  Control/stop worrying 0 0 0 0  Worry too much - different things 0 0 0 0  Trouble relaxing 0 0 0 0  Restless 0 0 0 0  Easily annoyed or irritable 0 0 0 0  Afraid - awful might happen 0 0 0 0  Total GAD 7 Score 0 0 0 0  Anxiety Difficulty Not difficult at all Not difficult at all Not difficult at all Not difficult at all       Assessment & Plan:  Assessment & Plan   There are no diagnoses linked to this encounter.   Assessment and Plan Assessment & Plan      Follow up plan: No follow-ups on file.  Hadassah SHAUNNA Nett, MD      "

## 2024-10-15 LAB — BASIC METABOLIC PANEL WITH GFR
BUN/Creatinine Ratio: 22 (ref 12–28)
BUN: 22 mg/dL (ref 8–27)
CO2: 23 mmol/L (ref 20–29)
Calcium: 10.2 mg/dL (ref 8.7–10.3)
Chloride: 103 mmol/L (ref 96–106)
Creatinine, Ser: 1 mg/dL (ref 0.57–1.00)
Glucose: 102 mg/dL — ABNORMAL HIGH (ref 70–99)
Potassium: 4 mmol/L (ref 3.5–5.2)
Sodium: 141 mmol/L (ref 134–144)
eGFR: 63 mL/min/1.73

## 2024-10-15 LAB — LIPID PANEL
Chol/HDL Ratio: 3.3 ratio (ref 0.0–4.4)
Cholesterol, Total: 125 mg/dL (ref 100–199)
HDL: 38 mg/dL — ABNORMAL LOW
LDL Chol Calc (NIH): 58 mg/dL (ref 0–99)
Triglycerides: 170 mg/dL — ABNORMAL HIGH (ref 0–149)
VLDL Cholesterol Cal: 29 mg/dL (ref 5–40)

## 2024-10-15 LAB — HEMOGLOBIN A1C
Est. average glucose Bld gHb Est-mCnc: 120 mg/dL
Hgb A1c MFr Bld: 5.8 % — ABNORMAL HIGH (ref 4.8–5.6)

## 2024-10-16 ENCOUNTER — Ambulatory Visit: Payer: Self-pay | Admitting: Pediatrics

## 2024-10-16 ENCOUNTER — Encounter: Payer: Self-pay | Admitting: Pediatrics

## 2024-10-16 DIAGNOSIS — M19012 Primary osteoarthritis, left shoulder: Secondary | ICD-10-CM | POA: Insufficient documentation

## 2024-10-16 DIAGNOSIS — G4700 Insomnia, unspecified: Secondary | ICD-10-CM | POA: Insufficient documentation

## 2024-10-16 DIAGNOSIS — R61 Generalized hyperhidrosis: Secondary | ICD-10-CM | POA: Insufficient documentation

## 2024-10-16 NOTE — Assessment & Plan Note (Signed)
 Severe osteoarthritis. Current medication ineffective. Patient hesitant about injections. - Discussed potential referral to orthopedics for injections. - Encouraged consideration of injections for relief.

## 2024-10-16 NOTE — Assessment & Plan Note (Addendum)
 Persistent excessive sweating despite current treatment. Previous treatment with Tussie was effective. - Prescribed Drysol. - Continue current ointment and powder for skin creases. - Consider dermatology referral if Drysol is ineffective.

## 2024-10-16 NOTE — Assessment & Plan Note (Signed)
 Well controlled due for repeat blood work.

## 2024-10-16 NOTE — Assessment & Plan Note (Signed)
 Chronic insomnia with inadequate response to current sleep aid. - Increased sleep aid dose to 3 mg. - Assistant to call in a week to assess effectiveness. - Consider switch to doxepin if ineffective.

## 2024-10-20 ENCOUNTER — Other Ambulatory Visit: Payer: Self-pay

## 2024-10-20 ENCOUNTER — Emergency Department
Admission: EM | Admit: 2024-10-20 | Discharge: 2024-10-20 | Disposition: A | Discharge status: Home / Self Care | Payer: PRIVATE HEALTH INSURANCE

## 2024-10-20 ENCOUNTER — Emergency Department: Payer: PRIVATE HEALTH INSURANCE

## 2024-10-20 DIAGNOSIS — I509 Heart failure, unspecified: Secondary | ICD-10-CM | POA: Diagnosis not present

## 2024-10-20 DIAGNOSIS — Z7901 Long term (current) use of anticoagulants: Secondary | ICD-10-CM | POA: Diagnosis not present

## 2024-10-20 DIAGNOSIS — W01190A Fall on same level from slipping, tripping and stumbling with subsequent striking against furniture, initial encounter: Secondary | ICD-10-CM | POA: Insufficient documentation

## 2024-10-20 DIAGNOSIS — S01111A Laceration without foreign body of right eyelid and periocular area, initial encounter: Secondary | ICD-10-CM | POA: Insufficient documentation

## 2024-10-20 DIAGNOSIS — W19XXXA Unspecified fall, initial encounter: Secondary | ICD-10-CM

## 2024-10-20 DIAGNOSIS — I110 Hypertensive heart disease with heart failure: Secondary | ICD-10-CM | POA: Insufficient documentation

## 2024-10-20 DIAGNOSIS — Z8541 Personal history of malignant neoplasm of cervix uteri: Secondary | ICD-10-CM | POA: Insufficient documentation

## 2024-10-20 DIAGNOSIS — S0993XA Unspecified injury of face, initial encounter: Secondary | ICD-10-CM | POA: Diagnosis present

## 2024-10-20 DIAGNOSIS — S0083XA Contusion of other part of head, initial encounter: Secondary | ICD-10-CM

## 2024-10-20 DIAGNOSIS — Y92009 Unspecified place in unspecified non-institutional (private) residence as the place of occurrence of the external cause: Secondary | ICD-10-CM | POA: Insufficient documentation

## 2024-10-20 MED ORDER — LIDOCAINE-EPINEPHRINE-TETRACAINE (LET) TOPICAL GEL
3.0000 mL | Freq: Once | TOPICAL | Status: AC
  Administered 2024-10-20: 3 mL via TOPICAL
  Filled 2024-10-20: qty 3

## 2024-10-20 NOTE — ED Triage Notes (Signed)
 Pt here after a fall today. Pt tripped on a step and hit her head on a wooden table. Pt denies LOC but states he nose began bleeding, pt is on blood thinners.

## 2024-10-20 NOTE — ED Provider Notes (Signed)
 "  [facility] Provider Note    Event Date/Time   First MD Initiated Contact with Patient 10/20/24 2111     (approximate)   History   Fall and Head Injury (/)    HPI  Tammy Chavez is a 63 y.o. female    with a past medical history of malignant neoplasm of endometrium, heart failure, hypertension,who presents to the ED complaining of fall. According to the patient, she had a mechanical fall today when he tripped on he her face.  Patient is taking blood thinners.  Patient denies loss of consciousness, blurry vision, vomit.  Patient is here with her daughter.    Patient Active Problem List   Diagnosis Date Noted   Osteoarthritis of left acromioclavicular joint 10/16/2024   Insomnia 10/16/2024   Hyperhidrosis 10/16/2024   Uterine cancer (HCC) 08/03/2024   Atrial fibrillation (HCC) 06/30/2023   Prediabetes 06/30/2023   Chronic cough 05/26/2023   Adult body mass index 50.0-59.9 (HCC) 01/09/2023   Obesity 10/23/2014   Primary hypertension 07/25/2011   Gout 07/25/2011   Hyperlipidemia 04/04/2010   Anemia 04/04/2010     Physical Exam   Triage Vital Signs: ED Triage Vitals  Encounter Vitals Group     BP 10/20/24 1826 (!) 126/58     Girls Systolic BP Percentile --      Girls Diastolic BP Percentile --      Boys Systolic BP Percentile --      Boys Diastolic BP Percentile --      Pulse Rate 10/20/24 1826 65     Resp 10/20/24 1826 18     Temp 10/20/24 1826 (!) 97.5 F (36.4 C)     Temp Source 10/20/24 1826 Oral     SpO2 10/20/24 1826 94 %     Weight 10/20/24 1826 254 lb (115.2 kg)     Height 10/20/24 1826 5' (1.524 m)     Head Circumference --      Peak Flow --      Pain Score 10/20/24 1836 7     Pain Loc --      Pain Education --      Exclude from Growth Chart --     Most recent vital signs: Vitals:   10/20/24 1826  BP: (!) 126/58  Pulse: 65  Resp: 18  Temp: (!) 97.5 F (36.4 C)  SpO2: 94%     Physical Exam Vitals and nursing  note reviewed.  In triage vital signs were normal.  General:          Awake, no distress.  Oriented x 4 Face: Presence of laceration in the right upper eyelid.  No active bleeding.  About 0.5 cm length, 2 mm depth. CV:                  Good peripheral perfusion. Regular rate and rhythm. Resp:               Normal effort. no tachypnea.Equal breath sounds bilaterally.  Abd:                 No distention.  Soft nontender Other:   Upper and lower extremities without signs of trauma.            ED Results / Procedures / Treatments   Labs (all labs ordered are listed, but only abnormal results are displayed) Labs Reviewed - No data to display     RADIOLOGY I independently reviewed and interpreted imaging and agree  with radiologists findings.     PROCEDURES:  Critical Care performed:   .Laceration Repair  Date/Time: 10/20/2024 10:14 PM  Performed by: Janit Kast, PA-C Authorized by: Janit Kast, PA-C   Consent:    Consent obtained:  Verbal   Consent given by:  Patient   Risks, benefits, and alternatives were discussed: yes     Risks discussed:  Pain Universal protocol:    Procedure explained and questions answered to patient or proxy's satisfaction: yes     Patient identity confirmed:  Verbally with patient Anesthesia:    Anesthesia method:  Topical application   Topical anesthetic:  LET Laceration details:    Location:  Face   Face location:  L upper eyelid   Length (cm):  0.5   Depth (mm):  3 Exploration:    Limited defect created (wound extended): no     Hemostasis achieved with:  LET   Imaging obtained: x-ray     Contaminated: no   Treatment:    Area cleansed with:  Povidone-iodine   Amount of cleaning:  Standard   Irrigation solution:  Sterile saline   Irrigation method:  Syringe   Debridement:  None   Undermining:  None   Scar revision: no   Skin repair:    Repair method:  Tissue adhesive Approximation:    Approximation:  Close Repair type:     Repair type:  Simple Post-procedure details:    Dressing:  Non-adherent dressing   Procedure completion:  Tolerated well, no immediate complications    MEDICATIONS ORDERED IN ED: Medications  lidocaine -EPINEPHrine -tetracaine  (LET) topical gel (3 mLs Topical Given 10/20/24 2148)   Clinical Course as of 10/20/24 2215  Thu Oct 20, 2024  2119 CT Head Wo Contrast No acute intracranial abnormality. 2. Left periorbital contusion.   [AE]    Clinical Course User Index [AE] Janit Kast, PA-C    IMPRESSION / MDM / ASSESSMENT AND PLAN / ED COURSE  I reviewed the triage vital signs and the nursing notes.  Differential diagnosis includes, but is not limited to, fall, intracranial hemorrhage, cervical fracture, laceration.  Patient's presentation is most consistent with acute complicated illness / injury requiring diagnostic workup.   Tammy Chavez is a 63 y.o., female who presents today with history of mechanical fall without loss of consciousness.  Patient is taking blood thinners.  See HPI for further information.  On a physical exam vital signs were normal.  Patient was oriented x 4.  Face, left upper eyelid presence of laceration, about 0.5 cm length, 2 mm depth.  No active bleeding.  Rest of physical exam is normal.  No other signs of trauma. Plan Laceration repair CT of the head showed no acute intracranial abnormality, left periorbital contusion. Patient's diagnosis is consistent with fall, left periorbital contusion, left upper lid laceration.. I independently reviewed and interpreted imaging and agree with radiologists finding.  I did not order any labs, physical exam reabsorbing. I did review the patient's allergies and medications.The patient is in stable and satisfactory condition for discharge home  Patient will be discharged home without prescriptions. Patient is to follow up with ECP as needed or otherwise directed. Patient is given ED precautions to return to the ED for  any worsening or new symptoms. Discussed plan of care with patient, answered all of patient's questions, patient agreeable to plan of care. Advised patient to take medications according to the instructions on the label. Discussed possible side effects of new medications. Patient verbalized understanding.  FINAL  CLINICAL IMPRESSION(S) / ED DIAGNOSES   Final diagnoses:  Contusion of face, initial encounter  Fall in home, initial encounter     Rx / DC Orders   ED Discharge Orders     None        Note:  This document was prepared using Dragon voice recognition software and may include unintentional dictation errors.   Janit Kast, PA-C 10/20/24 2215    Fernand Rossie HERO, MD 10/20/24 2354  "

## 2024-10-20 NOTE — Discharge Instructions (Addendum)
 You have been diagnosed with fall, facial contusion.  Please do not submerge your face in water.  Glue will come out by itself between 5 to 10 days.  Please come back to ED or with your PCP if you have any symptoms symptoms worsen.

## 2024-10-21 ENCOUNTER — Other Ambulatory Visit: Payer: Self-pay | Admitting: Pediatrics

## 2024-10-21 ENCOUNTER — Ambulatory Visit
Admission: EM | Admit: 2024-10-21 | Discharge: 2024-10-21 | Disposition: A | Discharge status: Home / Self Care | Payer: PRIVATE HEALTH INSURANCE | Attending: Emergency Medicine | Admitting: Emergency Medicine

## 2024-10-21 DIAGNOSIS — R52 Pain, unspecified: Secondary | ICD-10-CM

## 2024-10-21 DIAGNOSIS — Z5189 Encounter for other specified aftercare: Secondary | ICD-10-CM

## 2024-10-21 DIAGNOSIS — M25512 Pain in left shoulder: Secondary | ICD-10-CM

## 2024-10-21 MED ORDER — BACLOFEN 10 MG PO TABS: 10.0000 mg | ORAL_TABLET | Freq: Three times a day (TID) | ORAL | 0 refills | Status: AC

## 2024-10-21 NOTE — ED Provider Notes (Signed)
 " [facility]    CSN: [id number] Arrival date & time: 10/21/24  1055      History   Chief Complaint Chief Complaint  Patient presents with   Laceration    HPI Tammy Chavez is a 63 y.o. female.   HPI  63 year old female with past medical [place] for primary hypertension, hyperlipidemia, gout, obesity, chronic cough, A-fib, and prediabetes presents for evaluation for a wound check.  Past Medical History:  Diagnosis Date   Atrial fibrillation (HCC)    CAD (coronary artery disease)    Chronic diastolic heart failure (HCC)    Gout    Grade II diastolic dysfunction    Heel spur    Left   Hyperlipidemia    Hypertension    Mild mitral regurgitation by prior echocardiogram    Mild mitral regurgitation by prior echocardiogram    Uncontrolled hypertension     Patient Active Problem List   Diagnosis Date Noted   Osteoarthritis of left acromioclavicular joint 10/16/2024   Insomnia 10/16/2024   Hyperhidrosis 10/16/2024   Uterine cancer (HCC) 08/03/2024   Atrial fibrillation (HCC) 06/30/2023   Prediabetes 06/30/2023   Chronic cough 05/26/2023   Adult body mass index 50.0-59.9 (HCC) 01/09/2023   Obesity 10/23/2014   Primary hypertension 07/25/2011   Gout 07/25/2011   Hyperlipidemia 04/04/2010   Anemia 04/04/2010    Past Surgical History:  Procedure Laterality Date   ACHILLES TENDON REPAIR     CATARACT EXTRACTION W/PHACO Left 05/18/2024   Procedure: PHACOEMULSIFICATION, CATARACT, WITH IOL INSERTION 9.73, 00:46.3;  Surgeon: Mittie Gaskin, MD;  Location: [facility];  Service: Ophthalmology;  Laterality: Left;   RIGHT/LEFT HEART CATH AND CORONARY ANGIOGRAPHY Bilateral 08/26/2023   Procedure: RIGHT/LEFT HEART CATH AND CORONARY ANGIOGRAPHY;  Surgeon: Rolan Ezra RAMAN, MD;  Location: [facility];  Service: Cardiovascular;  Laterality: Bilateral;    OB History   No obstetric history on file.      Home Medications    Prior to  Admission medications  Medication Sig Start Date End Date Taking? Authorizing Provider  baclofen  (LIORESAL ) 10 MG tablet Take 1 tablet (10 mg total) by mouth 3 (three) times daily. 10/21/24  Yes Bernardino Ditch, NP  albuterol  (VENTOLIN  HFA) 108 (90 Base) MCG/ACT inhaler Inhale 2 puffs into the lungs every 6 (six) hours as needed for wheezing or shortness of breath. 05/26/23   Lang Dover, MD  allopurinol  (ZYLOPRIM ) 300 MG tablet Take 1 tablet (300 mg total) by mouth daily. 03/30/24   Herold Hadassah SQUIBB, MD  aluminum chloride (DRYSOL) 20 % external solution Apply topically 2 (two) times daily. Apply to areas of excessive sweating. 10/14/24   Herold Hadassah SQUIBB, MD  apixaban  (ELIQUIS ) 5 MG TABS tablet Take 1 tablet (5 mg total) by mouth 2 (two) times daily. 04/15/24   Clegg, Amy D, NP  atorvastatin  (LIPITOR) 80 MG tablet Take 1 tablet (80 mg total) by mouth daily. 06/22/24   Herold Hadassah SQUIBB, MD  Cyanocobalamin 5000 MCG TBDP Take 5,000 mcg by mouth daily.    [provider]  ENTRESTO  49-51 MG Take 1 tablet by mouth 2 (two) times daily. PLEASE SCHEDULE APPOINTMENT FOR MORE REFILLS [phone] OPTION 2 10/06/24   Rolan Ezra RAMAN, MD  eszopiclone  3 MG TABS Take 1 tablet (3 mg total) by mouth at bedtime as needed for sleep. Take immediately before bedtime 10/14/24   Herold Hadassah SQUIBB, MD  ezetimibe  (ZETIA ) 10 MG tablet Take 10 mg by mouth daily. 04/04/24  [provider]  FARXIGA  10 MG TABS tablet Take 1 tablet (10 mg total) by mouth daily before breakfast. NEEDS FOLLOW UP APPOINTMENT FOR MORE REFILLS 03/30/24   Herold Hadassah SQUIBB, MD  fenofibrate  160 MG tablet Take 1 tablet (160 mg total) by mouth daily. 07/15/24   Herold Hadassah SQUIBB, MD  ketoconazole  (NIZORAL ) 2 % cream Apply 1 Application topically daily. 08/26/24   Herold Hadassah SQUIBB, MD  meloxicam  (MOBIC ) 15 MG tablet Take 1 tablet (15 mg total) by mouth daily. 08/26/24   Herold Hadassah SQUIBB, MD  metoprolol  succinate (TOPROL -XL) 100 MG 24 hr tablet Take 1  tablet (100 mg total) by mouth daily. Take with or immediately following a meal. 03/08/24 10/14/24  [place] Sebastian Pool, NP  nystatin  (MYCOSTATIN /NYSTOP ) powder Apply 1 Application topically 3 (three) times daily. To inguinal skin folds as needed 06/01/24   Allen, Lauren G, NP  saline (AYR) GEL Place 1 Application into both nostrils every 8 (eight) hours. 09/06/24   Dharap, Anuja R, DO  spironolactone  (ALDACTONE ) 25 MG tablet Take 0.5 tablets (12.5 mg total) by mouth daily. 03/08/24 10/14/24  [facility] Sebastian Pool, NP    Family History Family History  Problem Relation Age of Onset   Heart disease Mother    Diabetes Sister    Diabetes Sister    Hyperlipidemia Brother    Hyperlipidemia Brother    Hyperlipidemia Brother    Bladder Cancer Maternal Uncle     Social History Social History[1]   Allergies   Lisinopril   Review of Systems Review of Systems  Skin:  Positive for color change and wound.     Physical Exam Triage Vital Signs ED Triage Vitals [10/21/24 1237]  Encounter Vitals Group     BP 138/84     Girls Systolic BP Percentile      Girls Diastolic BP Percentile      Boys Systolic BP Percentile      Boys Diastolic BP Percentile      Pulse Rate 80     Resp 18     Temp 98.4 F (36.9 C)     Temp Source Oral     SpO2 94 %     Weight      Height      Head Circumference      Peak Flow      Pain Score 7     Pain Loc      Pain Education      Exclude from Growth Chart    No data found.  Updated Vital Signs BP 138/84 (BP Location: Right Arm)   Pulse 80   Temp 98.4 F (36.9 C) (Oral)   Resp 18   LMP 11/03/2013   SpO2 94%   Visual Acuity Right Eye Distance:   Left Eye Distance:   Bilateral Distance:    Right Eye Near:   Left Eye Near:    Bilateral Near:     Physical Exam Vitals and nursing note reviewed.  Constitutional:      Appearance: Normal appearance. She is not ill-appearing.  HENT:     Head: Normocephalic.  Skin:    General: Skin  is warm and dry.     Capillary Refill: Capillary refill takes less than 2 seconds.     Findings: Bruising present.  Neurological:     General: No focal deficit present.     Mental Status: She is alert and oriented to person, place, and time.      UC  Treatments / Results  Labs (all labs ordered are listed, but only abnormal results are displayed) Labs Reviewed - No data to display  EKG   Radiology CT Head Wo Contrast Result Date: 10/20/2024 EXAM: CT HEAD WITHOUT CONTRAST 10/20/2024 07:25:51 PM TECHNIQUE: CT of the head was performed without the administration of intravenous contrast. Automated exposure control, iterative reconstruction, and/or weight based adjustment of the mA/kV was utilized to reduce the radiation dose to as low as reasonably achievable. COMPARISON: None available. CLINICAL HISTORY: Facial trauma, blunt FINDINGS: BRAIN AND VENTRICLES: No acute hemorrhage. No evidence of acute infarct. No hydrocephalus. No extra-axial collection. No mass effect or midline shift. ORBITS: No acute abnormality. Left periorbital contusion. SINUSES: No acute abnormality. SOFT TISSUES AND SKULL: No acute soft tissue abnormality. No skull fracture. IMPRESSION: 1. No acute intracranial abnormality. 2. Left periorbital contusion. Electronically signed by: Gilmore Molt 10/20/2024 07:36 PM EST RP Workstation: [id number]    Procedures Procedures (including critical care time)  Medications Ordered in UC Medications - No data to display  Initial Impression / Assessment and Plan / UC Course  I have reviewed the triage vital signs and the nursing notes.  Pertinent labs & imaging results that were available during my care of the patient were reviewed by me and considered in my medical decision making (see chart for details).   Patient is a pleasant, nontoxic-appearing 63 year old female presenting with request for reevaluation of a laceration she sustained to her left eyebrow yesterday.  She  suffered a ground-level fall around dusk and was seen in the ER at [facility].  The laceration was closed with skin glue and then staff applied a Band-Aid over top.  When the patient removed the Band-Aid this morning some of the glue came off and the wound started to bleed again.  No active bleeding at this time.  As you can see in the image above, the laceration is still closed.  There is a small tail of glue that has peeled away from the skin.  I have reinforced that with Dermabond.  The patient is also requesting a muscle relaxer to help with body aches after suffering her fall.  I will discharge her home on baclofen  10 mg 3 times daily that she can use in conjunction with Tylenol  for pain.   Final Clinical Impressions(s) / UC Diagnoses   Final diagnoses:  Visit for wound check  Body aches     Discharge Instructions      We have reinforced the glue on your eyebrow laceration.  Do not apply a Band-Aid over top as you risk pulling the glue off.  You may continue to use over-the-counter Tylenol  according to the package instructions as needed for any pain.  I have prescribed you baclofen , a nonsedating muscle relaxer, and you can take 1 tablet every 8 hours as needed for body aches.  Return for reevaluation, or see your primary care provider, for any new or worsening symptoms.     ED Prescriptions     Medication Sig Dispense Auth. Provider   baclofen  (LIORESAL ) 10 MG tablet Take 1 tablet (10 mg total) by mouth 3 (three) times daily. 30 each Bernardino Ditch, NP      PDMP not reviewed this encounter.    [1]  Social History Tobacco Use   Smoking status: Never   Smokeless tobacco: Never  Vaping Use   Vaping status: Never Used  Substance Use Topics   Alcohol use: No   Drug use: No  Bernardino Ditch, NP 10/21/24 1252  "

## 2024-10-21 NOTE — ED Triage Notes (Signed)
 Patient presents to UC for LAC to left upper eyelid. She states she was seen in the ED and skin glue applied. She states when she removed the band aid off this morning the glue was pulled off skin. Also req muscle relaxer for body aches from fall.

## 2024-10-21 NOTE — Discharge Instructions (Addendum)
 We have reinforced the glue on your eyebrow laceration.  Do not apply a Band-Aid over top as you risk pulling the glue off.  You may continue to use over-the-counter Tylenol  according to the package instructions as needed for any pain.  I have prescribed you baclofen , a nonsedating muscle relaxer, and you can take 1 tablet every 8 hours as needed for body aches.  Return for reevaluation, or see your primary care provider, for any new or worsening symptoms.

## 2024-12-01 ENCOUNTER — Other Ambulatory Visit: Payer: Self-pay

## 2024-12-01 DIAGNOSIS — M25512 Pain in left shoulder: Secondary | ICD-10-CM

## 2024-12-01 MED ORDER — MELOXICAM 15 MG PO TABS: 15.0000 mg | ORAL_TABLET | Freq: Every day | ORAL | 0 refills | Status: AC

## 2024-12-07 ENCOUNTER — Ambulatory Visit: Payer: PRIVATE HEALTH INSURANCE

## 2025-01-13 ENCOUNTER — Ambulatory Visit: Payer: PRIVATE HEALTH INSURANCE | Admitting: Nurse Practitioner
